# Patient Record
Sex: Male | Born: 1955 | Race: White | Hispanic: No | Marital: Married | Smoking: Former smoker
Health system: Southern US, Community
[De-identification: ages and names within clinical notes are randomized; demographics above are authoritative.]

## PROBLEM LIST (undated history)

## (undated) DIAGNOSIS — M1990 Unspecified osteoarthritis, unspecified site: Secondary | ICD-10-CM

## (undated) DIAGNOSIS — G8929 Other chronic pain: Secondary | ICD-10-CM

## (undated) DIAGNOSIS — M549 Dorsalgia, unspecified: Secondary | ICD-10-CM

## (undated) DIAGNOSIS — K219 Gastro-esophageal reflux disease without esophagitis: Secondary | ICD-10-CM

## (undated) DIAGNOSIS — Z9689 Presence of other specified functional implants: Secondary | ICD-10-CM

## (undated) DIAGNOSIS — N400 Enlarged prostate without lower urinary tract symptoms: Secondary | ICD-10-CM

## (undated) DIAGNOSIS — F419 Anxiety disorder, unspecified: Secondary | ICD-10-CM

## (undated) DIAGNOSIS — J449 Chronic obstructive pulmonary disease, unspecified: Secondary | ICD-10-CM

## (undated) DIAGNOSIS — I10 Essential (primary) hypertension: Secondary | ICD-10-CM

## (undated) HISTORY — PX: SPINAL CORD STIMULATOR INSERTION: SHX5378

## (undated) HISTORY — PX: SHOULDER SURGERY: SHX246

## (undated) HISTORY — PX: APPENDECTOMY: SHX54

## (undated) HISTORY — DX: Dorsalgia, unspecified: M54.9

## (undated) HISTORY — DX: Other chronic pain: G89.29

## (undated) HISTORY — PX: TENDON REPAIR: SHX5111

## (undated) HISTORY — DX: Gastro-esophageal reflux disease without esophagitis: K21.9

## (undated) HISTORY — DX: Benign prostatic hyperplasia without lower urinary tract symptoms: N40.0

---

## 1990-08-07 HISTORY — PX: ABDOMINAL SURGERY: SHX537

## 2001-08-07 DIAGNOSIS — K219 Gastro-esophageal reflux disease without esophagitis: Secondary | ICD-10-CM

## 2001-08-07 HISTORY — DX: Gastro-esophageal reflux disease without esophagitis: K21.9

## 2005-07-05 ENCOUNTER — Emergency Department (HOSPITAL_COMMUNITY): Admission: EM | Admit: 2005-07-05 | Discharge: 2005-07-06 | Payer: Self-pay | Admitting: Emergency Medicine

## 2011-04-19 ENCOUNTER — Emergency Department (HOSPITAL_COMMUNITY): Payer: PRIVATE HEALTH INSURANCE

## 2011-04-19 ENCOUNTER — Encounter: Payer: Self-pay | Admitting: *Deleted

## 2011-04-19 ENCOUNTER — Emergency Department (HOSPITAL_COMMUNITY)
Admission: EM | Admit: 2011-04-19 | Discharge: 2011-04-20 | Disposition: A | Discharge status: Home / Self Care | Payer: PRIVATE HEALTH INSURANCE | Attending: Emergency Medicine | Admitting: Emergency Medicine

## 2011-04-19 DIAGNOSIS — R05 Cough: Secondary | ICD-10-CM | POA: Insufficient documentation

## 2011-04-19 DIAGNOSIS — R059 Cough, unspecified: Secondary | ICD-10-CM | POA: Insufficient documentation

## 2011-04-19 DIAGNOSIS — R509 Fever, unspecified: Secondary | ICD-10-CM | POA: Insufficient documentation

## 2011-04-19 DIAGNOSIS — R112 Nausea with vomiting, unspecified: Secondary | ICD-10-CM | POA: Insufficient documentation

## 2011-04-19 DIAGNOSIS — Z87891 Personal history of nicotine dependence: Secondary | ICD-10-CM | POA: Insufficient documentation

## 2011-04-19 DIAGNOSIS — R42 Dizziness and giddiness: Secondary | ICD-10-CM | POA: Insufficient documentation

## 2011-04-19 NOTE — ED Notes (Signed)
Pt states he has been running fever and has n/v;

## 2011-04-19 NOTE — ED Provider Notes (Signed)
History     CSN: [id number] Arrival date & time: 04/19/2011  9:18 PM  Chief Complaint  Patient presents with  . Emesis  . Fever   Patient is a 55 y.o. male presenting with vomiting and fever. The history is provided by the patient and the spouse.  Emesis  This is a recurrent (Patient has had intermittent episodes feeling flu like symtpoms for the past month,  including all over body aches,  myalgia and subtle lightheadedness., which is not always worse with standing,  but can occur while supine.  ) problem. The current episode started more than 1 week ago (Worse the past 24 hours, with fever to 101 2 nights ago.  He reports an episode of emesis tonight only x 1.). The maximum temperature recorded prior to his arrival was 101 to 101.9 F. The fever has been present for less than 1 day. Associated symptoms include cough, a fever and sweats. Pertinent negatives include no abdominal pain, no arthralgias, no diarrhea and no headaches. Associated symptoms comments: Non productive cough.  Denies chest pain and denies shortness of breath..  Fever Primary symptoms of the febrile illness include fever, cough, nausea and vomiting. Primary symptoms do not include headaches, shortness of breath, abdominal pain, diarrhea, arthralgias or rash.    History reviewed. No pertinent past medical history.  Past Surgical History  Procedure Date  . Abdominal surgery   . Appendectomy   . Shoulder surgery right shoulder  . Tendon repair right wrist    History reviewed. No pertinent family history.  History  Substance Use Topics  . Smoking status: Former Games developer  . Smokeless tobacco: Not on file  . Alcohol Use: Not on file      Review of Systems  Constitutional: Positive for fever. Negative for appetite change.  HENT: Negative for congestion, sore throat and neck pain.   Eyes: Negative.   Respiratory: Positive for cough. Negative for chest tightness and shortness of breath.   Cardiovascular: Negative  for chest pain and palpitations.  Gastrointestinal: Positive for nausea and vomiting. Negative for abdominal pain, diarrhea, constipation and abdominal distention.  Genitourinary: Negative.   Musculoskeletal: Negative for joint swelling and arthralgias.  Skin: Negative.  Negative for rash and wound.  Neurological: Positive for light-headedness. Negative for dizziness, weakness, numbness and headaches.  Hematological: Negative.   Psychiatric/Behavioral: Negative.     Physical Exam  BP 127/79  Pulse 88  Temp(Src) 99.4 F (37.4 C) (Oral)  Resp 20  Ht 5\' 8"  (1.727 m)  Wt 206 lb (93.441 kg)  BMI 31.32 kg/m2  SpO2 95%  Physical Exam  Nursing note and vitals reviewed. Constitutional: He is oriented to person, place, and time. He appears well-developed and well-nourished.  HENT:  Head: Normocephalic and atraumatic.  Right Ear: External ear normal.  Left Ear: External ear normal.  Eyes: Conjunctivae are normal.  Neck: Normal range of motion. Neck supple. No thyromegaly present.  Cardiovascular: Normal rate, regular rhythm, normal heart sounds and intact distal pulses.   Pulmonary/Chest: Effort normal and breath sounds normal. No respiratory distress. He has no wheezes. He has no rales. He exhibits no tenderness.  Abdominal: Soft. Bowel sounds are normal. There is no tenderness.  Musculoskeletal: Normal range of motion. He exhibits no edema and no tenderness.  Lymphadenopathy:    He has no cervical adenopathy.  Neurological: He is alert and oriented to person, place, and time.  Skin: Skin is warm and dry.  Psychiatric: He has a normal mood and affect.  ED Course  Procedures  Patient received NS IV while here,  Given zithromax 500 IV (which he vomited up with the episode of emesis this evening).  MDM Febrile illness of unclear etiology,  With waxing/waning sx over the next past 3-4 weeks.  FUO without evidence for dangerous or life threatening illness tonight.  Labs,  cxr  reviewed with no pertinent positive findings.  Referral to pcp for further management.     Candis Musa, PA 04/20/11 [phone]

## 2011-04-19 NOTE — ED Notes (Addendum)
C/o of cold symptoms x 1 month, c/o light headedness and SOB, N/V x 1 today, was told yesterday by PCP that he had allergies, denies CP

## 2011-04-20 LAB — URINALYSIS, ROUTINE W REFLEX MICROSCOPIC
Bilirubin Urine: NEGATIVE
Glucose, UA: NEGATIVE mg/dL
Hgb urine dipstick: NEGATIVE
Specific Gravity, Urine: >1.03 — ABNORMAL HIGH (ref 1.005–1.030)
Urobilinogen, UA: 0.2 mg/dL (ref 0.0–1.0)
pH: 6 (ref 5.0–8.0)

## 2011-04-20 LAB — DIFFERENTIAL
Eosinophils Absolute: 0.2 10*3/uL (ref 0.0–0.7)
Eosinophils Relative: 2 % (ref 0–5)
Lymphocytes Relative: 25 % (ref 12–46)
Lymphs Abs: 2 10*3/uL (ref 0.7–4.0)
Monocytes Absolute: 0.8 10*3/uL (ref 0.1–1.0)
Monocytes Relative: 9 % (ref 3–12)

## 2011-04-20 LAB — BASIC METABOLIC PANEL
BUN: 13 mg/dL (ref 6–23)
CO2: 27 mEq/L (ref 19–32)
Calcium: 9.9 mg/dL (ref 8.4–10.5)
GFR calc non Af Amer: >60 mL/min (ref >60)
Glucose, Bld: 113 mg/dL — ABNORMAL HIGH (ref 70–99)

## 2011-04-20 LAB — CBC
HCT: 46.1 % (ref 39.0–52.0)
Hemoglobin: 16 g/dL (ref 13.0–17.0)
MCH: 31.1 pg (ref 26.0–34.0)
MCV: 89.5 fL (ref 78.0–100.0)
RBC: 5.15 MIL/uL (ref 4.22–5.81)

## 2011-04-20 MED ORDER — DEXTROSE 5 % IV SOLN
500.0000 mg | INTRAVENOUS | Status: DC
  Administered 2011-04-20: 500 mg via INTRAVENOUS
  Filled 2011-04-20: qty 500

## 2011-04-20 MED ORDER — SODIUM CHLORIDE 0.9 % IV BOLUS (SEPSIS)
1000.0000 mL | Freq: Once | INTRAVENOUS | Status: AC
  Administered 2011-04-20: 1000 mL via INTRAVENOUS

## 2011-04-20 NOTE — ED Notes (Signed)
Provided patient with sandwich and ginger ale per nurse

## 2011-04-26 NOTE — ED Provider Notes (Signed)
Medical screening examination/treatment/procedure(s) were performed by non-physician practitioner and as supervising physician I was immediately available for consultation/collaboration.   Geoffery Lyons, MD 04/26/11 [phone]

## 2011-07-20 ENCOUNTER — Telehealth: Payer: Self-pay

## 2011-07-20 NOTE — Telephone Encounter (Signed)
Pt referred for colonoscopy. Needs OV first due to meds. LMOM for a return call.

## 2011-07-24 NOTE — Telephone Encounter (Signed)
Pt has appt on 12/192012 for an OV.

## 2011-07-26 ENCOUNTER — Encounter: Payer: Self-pay | Admitting: Urgent Care

## 2011-07-26 ENCOUNTER — Ambulatory Visit (INDEPENDENT_AMBULATORY_CARE_PROVIDER_SITE_OTHER): Payer: Medicare Other | Admitting: Urgent Care

## 2011-07-26 DIAGNOSIS — Z1211 Encounter for screening for malignant neoplasm of colon: Secondary | ICD-10-CM

## 2011-07-26 DIAGNOSIS — K219 Gastro-esophageal reflux disease without esophagitis: Secondary | ICD-10-CM | POA: Insufficient documentation

## 2011-07-26 MED ORDER — PEG-KCL-NACL-NASULF-NA ASC-C 100 G PO SOLR: 1.0000 | Freq: Once | ORAL | Status: DC

## 2011-07-26 NOTE — Assessment & Plan Note (Signed)
well-controlled on Nexium 40 mg daily. Screening EGD for Barrett esophagus at the same time as colonoscopy given chronic GERD for many years.  I have discussed risks & benefits which include, but are not limited to, bleeding, infection, perforation & drug reaction.  The patient agrees with this plan & written consent will be obtained.

## 2011-07-26 NOTE — Assessment & Plan Note (Signed)
Douglas Davidson is a pleasant 55 y.o. male due for screening colonoscopy. No alarm features. I have discussed risks & benefits which include, but are not limited to, bleeding, infection, perforation & drug reaction.  The patient agrees with this plan & written consent will be obtained.  Procedure will need to be done with deep sedation (propofol) in the OR under the direction of anesthesia services for polypharmacy.

## 2011-07-26 NOTE — Patient Instructions (Addendum)
You need a screening colonoscopy and EGD. Continue Nexium 40 mg daily.

## 2011-07-26 NOTE — Progress Notes (Signed)
Referring Provider: Omar Person, FNP, [facility], [place], [place] Primary Care Physician:  Omar Person, FNP, [facility] Primary Gastroenterologist:  Dr. Jena Gauss  Chief Complaint  Patient presents with  . Colonoscopy    HPI:  Douglas Davidson is a 55 y.o. male here as a referral from Omar Person, FNP screening colonoscopy. He was brought into the office due to his medications with the concern for the need of propofol for adequate sedation for his procedure. Upon further questioning it is noted that he has chronic GERD with symptoms more than 3 days a week for many years, and he is a candidate for screening EGD for Barrett's esophagus. His GERD has been well-controlled on Nexium 40 mg daily for several years now. Denies any upper GI symptoms including heartburn, indigestion, nausea, vomiting, dysphagia, odynophagia or anorexia. Denies any lower GI symptoms including constipation, diarrhea, rectal bleeding, melena or weight loss.  Past Medical History  Diagnosis Date  . BPH (benign prostatic hypertrophy)   . Chronic back pain     & neck  . GERD (gastroesophageal reflux disease) 2003    Past Surgical History  Procedure Date  . Abdominal surgery 1992    adhesions/?colon  . Appendectomy   . Shoulder surgery right shoulder  . Tendon repair right wrist    Current Outpatient Prescriptions  Medication Sig Dispense Refill  . cetirizine (ZYRTEC) 10 MG tablet Take 10 mg by mouth daily.        Effie Berkshire 18-103 MCG/ACT inhaler Inhale 2 puffs into the lungs Twice daily.      . cyclobenzaprine (FLEXERIL) 10 MG tablet Take 10 mg by mouth 3 (three) times daily as needed.        . doxazosin (CARDURA) 8 MG tablet Take 8 mg by mouth at bedtime.        . DULoxetine (CYMBALTA) 60 MG capsule Take 60 mg by mouth daily.        . finasteride (PROSCAR) 5 MG tablet Take 5 mg by mouth daily.       . fluticasone (VERAMYST) 27.5 MCG/SPRAY nasal spray Place 2 sprays into the nose daily.         Marland Kitchen NEXIUM 40 MG capsule Take 40 mg by mouth daily before breakfast.       . oxyCODONE-acetaminophen (PERCOCET) 7.5-325 MG per tablet Take 1 tablet by mouth every 4 (four) hours as needed. For pain       . Pediatric Multivit-Minerals-C (GUMMI BEAR MULTIVITAMIN/MIN PO) Take 1 each by mouth daily.        . peg 3350 powder (MOVIPREP) 100 G SOLR Take 1 kit (100 g total) by mouth once. As directed Please purchase 1 Fleets enema to use with the prep  1 kit  0    Allergies as of 07/26/2011  . (No Known Allergies)    Family History:There is no known family history of colorectal carcinoma , liver disease, or inflammatory bowel disease.  Problem Relation Age of Onset  . Prostate cancer Father     & brother  . COPD Father   . Stroke Father   . Coronary artery disease Father   . Lymphoma Mother   . Hypertension Mother     History   Social History  . Marital Status: Married    Spouse Name: N/A    Number of Children: 0  . Years of Education: N/A   Occupational History  . disabled-truck driver    Social History Main Topics  . Smoking  status: Former Smoker -- 1.0 packs/day for 35 years    Types: Cigarettes    Quit date: 07/25/2010  . Smokeless tobacco: Not on file  . Alcohol Use: No  . Drug Use: No  . Sexually Active: Not on file   Other Topics Concern  . Not on file   Social History Narrative   Lives w/ wife & 5 adopted children  Review of Systems: Gen: Denies any fever, chills, sweats, anorexia, fatigue, weakness, malaise, weight loss, and sleep disorder CV: Denies chest pain, angina, palpitations, syncope, orthopnea, PND, peripheral edema, and claudication. Resp: Denies dyspnea at rest, dyspnea with exercise, cough, sputum, wheezing, coughing up blood, and pleurisy. GI: Denies vomiting blood, jaundice, and fecal incontinence.   Denies dysphagia or odynophagia. GU : Denies urinary burning, blood in urine, urinary frequency, urinary hesitancy, nocturnal urination, and  urinary incontinence. MS: Denies joint pain, limitation of movement, and swelling, stiffness, low back pain, extremity pain. Denies muscle weakness, cramps, atrophy.  Derm: Denies rash, itching, dry skin, hives, moles, warts, or unhealing ulcers.  Psych: Denies depression, anxiety, memory loss, suicidal ideation, hallucinations, paranoia, and confusion. Heme: Denies bruising, bleeding, and enlarged lymph nodes. Neuro:  Denies any headaches, dizziness, paresthesias. Endo:  Denies any problems with DM, thyroid, adrenal function.  Physical Exam: BP 123/82  Pulse 101  Temp(Src) 98 F (36.7 C) (Temporal)  Ht 5\' 8"  (1.727 m)  Wt 214 lb 9.6 oz (97.342 kg)  BMI 32.63 kg/m2 General:   Alert,  Well-developed, well-nourished, pleasant and cooperative in NAD.  Accompanied by his wife today, [place]. Head:  Normocephalic and atraumatic. Eyes:  Sclera clear, no icterus.   Conjunctiva pink. Ears:  Normal auditory acuity. Nose:  No deformity, discharge, or lesions. Mouth:  No deformity or lesions,oropharynx pink & moist. Neck:  Supple; no masses or thyromegaly. Lungs:  Clear throughout to auscultation.   No wheezes, crackles, or rhonchi. No acute distress. Heart:  Regular rate and rhythm; no murmurs, clicks, rubs,  or gallops. Abdomen:  Normal bowel sounds.  No bruits.  Soft, non-tender and non-distended without masses, hepatosplenomegaly or hernias noted.  No guarding or rebound tenderness.   Rectal:  Deferred. Msk:  Symmetrical without gross deformities. Normal posture. Pulses:  Normal pulses noted. Extremities:  No clubbing or edema. Neurologic:  Alert and oriented x4;  grossly normal neurologically. Skin:  Intact without significant lesions or rashes. Lymph Nodes:  No significant cervical adenopathy. Psych:  Alert and cooperative. Normal mood and affect.

## 2011-07-26 NOTE — Progress Notes (Signed)
Cc to PCP 

## 2011-08-10 ENCOUNTER — Encounter (HOSPITAL_COMMUNITY): Payer: Self-pay | Admitting: Pharmacy Technician

## 2011-08-18 ENCOUNTER — Other Ambulatory Visit: Payer: Self-pay

## 2011-08-18 ENCOUNTER — Encounter (HOSPITAL_COMMUNITY): Payer: Self-pay

## 2011-08-18 ENCOUNTER — Encounter (HOSPITAL_COMMUNITY)
Admission: RE | Admit: 2011-08-18 | Discharge: 2011-08-18 | Disposition: A | Discharge status: Home / Self Care | Payer: Medicare Other | Source: Ambulatory Visit | Attending: Internal Medicine | Admitting: Internal Medicine

## 2011-08-18 HISTORY — DX: Essential (primary) hypertension: I10

## 2011-08-18 HISTORY — DX: Chronic obstructive pulmonary disease, unspecified: J44.9

## 2011-08-18 HISTORY — DX: Unspecified osteoarthritis, unspecified site: M19.90

## 2011-08-18 HISTORY — DX: Anxiety disorder, unspecified: F41.9

## 2011-08-18 LAB — BASIC METABOLIC PANEL
BUN: 13 mg/dL (ref 6–23)
Creatinine, Ser: 1.05 mg/dL (ref 0.50–1.35)
GFR calc Af Amer: >90 mL/min (ref >90)
GFR calc non Af Amer: 79 mL/min — ABNORMAL LOW (ref >90)
Glucose, Bld: 107 mg/dL — ABNORMAL HIGH (ref 70–99)

## 2011-08-18 NOTE — Patient Instructions (Addendum)
21 Douglas Davidson  08/18/2011   Your procedure is scheduled on:  08/24/2011  Report to [facility] at  755  AM.  Call this number if you have problems the morning of surgery: [phone]   Remember:   Do not eat food:After Midnight.  May have clear liquids:until Midnight .  Clear liquids include soda, tea, black coffee, apple or grape juice, broth.  Take these medicines the morning of surgery with A SIP OF WATER: zyrtec,flexaril,doxazosin,duloxetine,nexium,oxycodone. Take combivent and fluticasone before you come.   Do not wear jewelry, make-up or nail polish.  Do not wear lotions, powders, or perfumes. You may wear deodorant.  Do not shave 48 hours prior to surgery.  Do not bring valuables to the hospital.  Contacts, dentures or bridgework may not be worn into surgery.  Leave suitcase in the car. After surgery it may be brought to your room.  For patients admitted to the hospital, checkout time is 11:00 AM the day of discharge.   Patients discharged the day of surgery will not be allowed to drive home.  Name and phone number of your driver: family  Special Instructions: N/A   Please read over the following fact sheets that you were given: Pain Booklet, Surgical Site Infection Prevention, Anesthesia Post-op Instructions and Care and Recovery After Surgery Esophagogastroduodenoscopy This is an endoscopic procedure (a procedure that uses a device like a flexible telescope) that allows your caregiver to view the upper stomach and small bowel. This test allows your caregiver to look at the esophagus. The esophagus carries food from your mouth to your stomach. They can also look at your duodenum. This is the first part of the small intestine that attaches to the stomach. This test is used to detect problems in the bowel such as ulcers and inflammation. PREPARATION FOR TEST Nothing to eat after midnight the day before the test. NORMAL FINDINGS Normal esophagus, stomach, and duodenum. Ranges  for normal findings may vary among different laboratories and hospitals. You should always check with your doctor after having lab work or other tests done to discuss the meaning of your test results and whether your values are considered within normal limits. MEANING OF TEST  Your caregiver will go over the test results with you and discuss the importance and meaning of your results, as well as treatment options and the need for additional tests if necessary. OBTAINING THE TEST RESULTS It is your responsibility to obtain your test results. Ask the lab or department performing the test when and how you will get your results. Document Released: 11/24/2004 Document Revised: 04/05/2011 Document Reviewed: 07/03/2008 [facility] Patient Information 2012 [place], [place].Colonoscopy A colonoscopy is an exam to evaluate your entire colon. In this exam, your colon is cleansed. A long fiberoptic tube is inserted through your rectum and into your colon. The fiberoptic scope (endoscope) is a long bundle of enclosed and very flexible fibers. These fibers transmit light to the area examined and send images from that area to your caregiver. Discomfort is usually minimal. You may be given a drug to help you sleep (sedative) during or prior to the procedure. This exam helps to detect lumps (tumors), polyps, inflammation, and areas of bleeding. Your caregiver may also take a small piece of tissue (biopsy) that will be examined under a microscope. LET YOUR CAREGIVER KNOW ABOUT:   Allergies to food or medicine.   Medicines taken, including vitamins, herbs, eyedrops, over-the-counter medicines, and creams.   Use of steroids (by mouth or creams).  Previous problems with anesthetics or numbing medicines.   History of bleeding problems or blood clots.   Previous surgery.   Other health problems, including diabetes and kidney problems.   Possibility of pregnancy, if this applies.  BEFORE THE PROCEDURE   A clear  liquid diet may be required for 2 days before the exam.   Ask your caregiver about changing or stopping your regular medications.   Liquid injections (enemas) or laxatives may be required.   A large amount of electrolyte solution may be given to you to drink over a short period of time. This solution is used to clean out your colon.   You should be present 60 minutes prior to your procedure or as directed by your caregiver.  AFTER THE PROCEDURE   If you received a sedative or pain relieving medication, you will need to arrange for someone to drive you home.   Occasionally, there is a little blood passed with the first bowel movement. Do not be concerned.  FINDING OUT THE RESULTS OF YOUR TEST Not all test results are available during your visit. If your test results are not back during the visit, make an appointment with your caregiver to find out the results. Do not assume everything is normal if you have not heard from your caregiver or the medical facility. It is important for you to follow up on all of your test results. HOME CARE INSTRUCTIONS   It is not unusual to pass moderate amounts of gas and experience mild abdominal cramping following the procedure. This is due to air being used to inflate your colon during the exam. Walking or a warm pack on your belly (abdomen) may help.   You may resume all normal meals and activities after sedatives and medicines have worn off.   Only take over-the-counter or prescription medicines for pain, discomfort, or fever as directed by your caregiver. Do not use aspirin or blood thinners if a biopsy was taken. Consult your caregiver for medicine usage if biopsies were taken.  SEEK IMMEDIATE MEDICAL CARE IF:   You have a fever.   You pass large blood clots or fill a toilet with blood following the procedure. This may also occur 10 to 14 days following the procedure. This is more likely if a biopsy was taken.   You develop abdominal pain that keeps  getting worse and cannot be relieved with medicine.  Document Released: 07/21/2000 Document Revised: 04/05/2011 Document Reviewed: 03/05/2008 [facility] Patient Information 2012 [place], [place].PATIENT INSTRUCTIONS POST-ANESTHESIA  IMMEDIATELY FOLLOWING SURGERY:  Do not drive or operate machinery for the first twenty four hours after surgery.  Do not make any important decisions for twenty four hours after surgery or while taking narcotic pain medications or sedatives.  If you develop intractable nausea and vomiting or a severe headache please notify your doctor immediately.  FOLLOW-UP:  Please make an appointment with your surgeon as instructed. You do not need to follow up with anesthesia unless specifically instructed to do so.  WOUND CARE INSTRUCTIONS (if applicable):  Keep a dry clean dressing on the anesthesia/puncture wound site if there is drainage.  Once the wound has quit draining you may leave it open to air.  Generally you should leave the bandage intact for twenty four hours unless there is drainage.  If the epidural site drains for more than 36-48 hours please call the anesthesia department.  QUESTIONS?:  Please feel free to call your physician or the hospital operator if you have any questions,  and they will be happy to assist you.     [facility] Anesthesia Department [address] [place] [phone]

## 2011-08-22 NOTE — Progress Notes (Signed)
Results Faxed to PCP.

## 2011-08-24 ENCOUNTER — Encounter (HOSPITAL_COMMUNITY): Payer: Self-pay | Admitting: *Deleted

## 2011-08-24 ENCOUNTER — Encounter (HOSPITAL_COMMUNITY)
Admission: RE | Disposition: A | Discharge status: Home / Self Care | Payer: Self-pay | Source: Ambulatory Visit | Attending: Internal Medicine

## 2011-08-24 ENCOUNTER — Ambulatory Visit (HOSPITAL_COMMUNITY)
Admission: RE | Admit: 2011-08-24 | Discharge: 2011-08-24 | Disposition: A | Discharge status: Home / Self Care | Payer: Medicare Other | Source: Ambulatory Visit | Attending: Internal Medicine | Admitting: Internal Medicine

## 2011-08-24 ENCOUNTER — Encounter (HOSPITAL_COMMUNITY): Payer: Self-pay | Admitting: Anesthesiology

## 2011-08-24 ENCOUNTER — Ambulatory Visit (HOSPITAL_COMMUNITY): Payer: Medicare Other | Admitting: Anesthesiology

## 2011-08-24 ENCOUNTER — Other Ambulatory Visit: Payer: Self-pay | Admitting: Internal Medicine

## 2011-08-24 DIAGNOSIS — Z800 Family history of malignant neoplasm of digestive organs: Secondary | ICD-10-CM

## 2011-08-24 DIAGNOSIS — D126 Benign neoplasm of colon, unspecified: Secondary | ICD-10-CM

## 2011-08-24 DIAGNOSIS — D129 Benign neoplasm of anus and anal canal: Secondary | ICD-10-CM

## 2011-08-24 DIAGNOSIS — K219 Gastro-esophageal reflux disease without esophagitis: Secondary | ICD-10-CM | POA: Insufficient documentation

## 2011-08-24 DIAGNOSIS — Z01810 Encounter for preprocedural cardiovascular examination: Secondary | ICD-10-CM | POA: Insufficient documentation

## 2011-08-24 DIAGNOSIS — Z1211 Encounter for screening for malignant neoplasm of colon: Secondary | ICD-10-CM

## 2011-08-24 DIAGNOSIS — D128 Benign neoplasm of rectum: Secondary | ICD-10-CM

## 2011-08-24 DIAGNOSIS — K449 Diaphragmatic hernia without obstruction or gangrene: Secondary | ICD-10-CM

## 2011-08-24 DIAGNOSIS — J449 Chronic obstructive pulmonary disease, unspecified: Secondary | ICD-10-CM | POA: Insufficient documentation

## 2011-08-24 DIAGNOSIS — Z79899 Other long term (current) drug therapy: Secondary | ICD-10-CM | POA: Insufficient documentation

## 2011-08-24 DIAGNOSIS — J4489 Other specified chronic obstructive pulmonary disease: Secondary | ICD-10-CM | POA: Insufficient documentation

## 2011-08-24 DIAGNOSIS — I10 Essential (primary) hypertension: Secondary | ICD-10-CM | POA: Insufficient documentation

## 2011-08-24 DIAGNOSIS — Z01812 Encounter for preprocedural laboratory examination: Secondary | ICD-10-CM | POA: Insufficient documentation

## 2011-08-24 HISTORY — PX: POLYPECTOMY: SHX5525

## 2011-08-24 SURGERY — COLONOSCOPY WITH PROPOFOL
Anesthesia: Monitor Anesthesia Care

## 2011-08-24 MED ORDER — MIDAZOLAM HCL 5 MG/5ML IJ SOLN
INTRAMUSCULAR | Status: DC | PRN
  Administered 2011-08-24: 2 mg via INTRAVENOUS

## 2011-08-24 MED ORDER — MIDAZOLAM HCL 2 MG/2ML IJ SOLN
INTRAMUSCULAR | Status: AC
  Filled 2011-08-24: qty 2

## 2011-08-24 MED ORDER — PROPOFOL 10 MG/ML IV EMUL
INTRAVENOUS | Status: AC
  Filled 2011-08-24: qty 20

## 2011-08-24 MED ORDER — STERILE WATER FOR IRRIGATION IR SOLN
Status: DC | PRN
  Administered 2011-08-24: 10:00:00

## 2011-08-24 MED ORDER — MIDAZOLAM HCL 2 MG/2ML IJ SOLN
INTRAMUSCULAR | Status: AC
  Administered 2011-08-24: 2 mg via INTRAVENOUS
  Filled 2011-08-24: qty 2

## 2011-08-24 MED ORDER — FENTANYL CITRATE 0.05 MG/ML IJ SOLN
INTRAMUSCULAR | Status: DC | PRN
  Administered 2011-08-24: 50 ug via INTRAVENOUS

## 2011-08-24 MED ORDER — LACTATED RINGERS IV SOLN
INTRAVENOUS | Status: DC
  Administered 2011-08-24: 09:00:00 via INTRAVENOUS

## 2011-08-24 MED ORDER — PROPOFOL 10 MG/ML IV EMUL
INTRAVENOUS | Status: DC | PRN
  Administered 2011-08-24: 100 ug/kg/min via INTRAVENOUS

## 2011-08-24 MED ORDER — ONDANSETRON HCL 4 MG/2ML IJ SOLN
4.0000 mg | Freq: Once | INTRAMUSCULAR | Status: AC
  Administered 2011-08-24: 4 mg via INTRAVENOUS

## 2011-08-24 MED ORDER — MIDAZOLAM HCL 2 MG/2ML IJ SOLN
1.0000 mg | INTRAMUSCULAR | Status: DC | PRN
  Administered 2011-08-24: 2 mg via INTRAVENOUS

## 2011-08-24 MED ORDER — ONDANSETRON HCL 4 MG/2ML IJ SOLN
INTRAMUSCULAR | Status: AC
  Administered 2011-08-24: 4 mg via INTRAVENOUS
  Filled 2011-08-24: qty 2

## 2011-08-24 MED ORDER — BUTAMBEN-TETRACAINE-BENZOCAINE 2-2-14 % EX AERO
1.0000 | INHALATION_SPRAY | Freq: Once | CUTANEOUS | Status: AC
  Administered 2011-08-24: 1 via TOPICAL

## 2011-08-24 MED ORDER — PROPOFOL 10 MG/ML IV BOLUS
INTRAVENOUS | Status: DC | PRN
  Administered 2011-08-24: 20 mg via INTRAVENOUS
  Administered 2011-08-24: 10 mg via INTRAVENOUS
  Administered 2011-08-24: 20 mg via INTRAVENOUS

## 2011-08-24 MED ORDER — FENTANYL CITRATE 0.05 MG/ML IJ SOLN: 25.0000 ug | INTRAMUSCULAR | Status: DC | PRN

## 2011-08-24 MED ORDER — ONDANSETRON HCL 4 MG/2ML IJ SOLN: 4.0000 mg | Freq: Once | INTRAMUSCULAR | Status: DC | PRN

## 2011-08-24 MED ORDER — FENTANYL CITRATE 0.05 MG/ML IJ SOLN
INTRAMUSCULAR | Status: AC
  Filled 2011-08-24: qty 2

## 2011-08-24 SURGICAL SUPPLY — 25 items
BLOCK BITE 60FR ADLT L/F BLUE (MISCELLANEOUS) ×4 IMPLANT
DEVICE CLIP HEMOSTAT 235CM (CLIP) IMPLANT
ELECT REM PT RETURN 9FT ADLT (ELECTROSURGICAL)
ELECTRODE REM PT RTRN 9FT ADLT (ELECTROSURGICAL) IMPLANT
FCP BXJMBJMB 240X2.8X (CUTTING FORCEPS)
FLOOR PAD 36X40 (MISCELLANEOUS) ×2
FORCEP RJ3 GP 1.8X160 W-NEEDLE (CUTTING FORCEPS) ×2 IMPLANT
FORCEPS BIOP RAD 4 LRG CAP 4 (CUTTING FORCEPS) IMPLANT
FORCEPS BIOP RJ4 240 W/NDL (CUTTING FORCEPS)
FORCEPS BXJMBJMB 240X2.8X (CUTTING FORCEPS) IMPLANT
INJECTOR/SNARE I SNARE (MISCELLANEOUS) IMPLANT
LUBRICANT JELLY 4.5OZ STERILE (MISCELLANEOUS) ×2 IMPLANT
MANIFOLD NEPTUNE II (INSTRUMENTS) ×2 IMPLANT
MANIFOLD NEPTUNE WASTE (CANNULA) ×2 IMPLANT
NEEDLE SCLEROTHERAPY 25GX240 (NEEDLE) IMPLANT
PAD FLOOR 36X40 (MISCELLANEOUS) ×1 IMPLANT
PROBE APC STR FIRE (PROBE) IMPLANT
PROBE INJECTION GOLD (MISCELLANEOUS)
PROBE INJECTION GOLD 7FR (MISCELLANEOUS) IMPLANT
SNARE ROTATE MED OVAL 20MM (MISCELLANEOUS) IMPLANT
SYR 50ML LL SCALE MARK (SYRINGE) ×2 IMPLANT
TRAP SPECIMEN MUCOUS 40CC (MISCELLANEOUS) IMPLANT
TUBING ENDO SMARTCAP PENTAX (MISCELLANEOUS) ×2 IMPLANT
TUBING IRRIGATION ENDOGATOR (MISCELLANEOUS) ×2 IMPLANT
WATER STERILE IRR 1000ML POUR (IV SOLUTION) ×2 IMPLANT

## 2011-08-24 NOTE — Transfer of Care (Signed)
Immediate Anesthesia Transfer of Care Note  Patient: Douglas Davidson  Procedure(s) Performed:  COLONOSCOPY WITH PROPOFOL - start @ 0957. Entered cecum @ 1006; total withdrawal time =13       minutes; ESOPHAGOGASTRODUODENOSCOPY (EGD) WITH PROPOFOL - EGD end @ 0953; POLYPECTOMY - colon polyps  Patient Location: PACU  Anesthesia Type: MAC  Level of Consciousness: awake, alert  and oriented  Airway & Oxygen Therapy: Patient Spontanous Breathing and Patient connected to face mask oxygen  Post-op Assessment: Report given to PACU RN  Post vital signs: Reviewed and stable Filed Vitals:   08/24/11 0936  BP: 114/76  Pulse:   Temp:   Resp: 29    Complications: No apparent anesthesia complications

## 2011-08-24 NOTE — Op Note (Addendum)
[facility] [address] [place], [place]  [address]  ENDOSCOPY PROCEDURE REPORT  PATIENT:  Douglas, Davidson  MR#:  [id number] BIRTHDATE:  [birth year], 55 yrs. old  GENDER:  male  ENDOSCOPIST:  R. Roetta Sessions, MD [facility] Referred by:          Omar Person, FNP. [facility]  PROCEDURE DATE:  08/24/2011 PROCEDURE:  diagnostic EGD  INDICATIONS:  long-standing GERD  INFORMED CONSENT:   The risks, benefits, limitations, alternatives and imponderables have been discussed.  The potential for biopsy, esophogeal dilation, etc. have also been reviewed.  Questions have been answered.  All parties agreeable.  Please see the history and physical in the medical record for more information.  MEDICATIONS:   deep sedation per Dr. [facility].  DESCRIPTION OF PROCEDURE:   The  endoscope was introduced through the mouth and advanced to the second portion of the duodenum without difficulty or limitations.  The mucosal surfaces were surveyed very carefully during advancement of the scope and upon withdrawal.  Retroflexion view of the proximal stomach and esophagogastric junction was performed.  <<PROCEDUREIMAGES>>  FINDINGS:    normal esophageal mucosa. The stomach emptied. A small hiatal hernia; otherwise normal gastric mucosa. Normal first and second portion of the duodenum  THERAPEUTIC / DIAGNOSTIC MANEUVERS PERFORMED:    none  COMPLICATIONS:   None  IMPRESSION:       Small hiatal hernia; otherwise normal EGD  RECOMMENDATIONS:   Continue omeprazole daily indefinitely. See colonoscopy report  ______________________________ R. Roetta Sessions, MD [facility]  CC:  n. REVISED:  08/24/2011 11:38 AM eSIGNED:   R. Roetta Sessions at 08/24/2011 11:38 AM  [name], [phone]

## 2011-08-24 NOTE — Anesthesia Postprocedure Evaluation (Signed)
  Anesthesia Post-op Note  Patient: Douglas Davidson  Procedure(s) Performed:  COLONOSCOPY WITH PROPOFOL - start @ 0957. Entered cecum @ 1006; total withdrawal time =13       minutes; ESOPHAGOGASTRODUODENOSCOPY (EGD) WITH PROPOFOL - EGD end @ 0953; POLYPECTOMY - colon polyps  Patient Location: PACU  Anesthesia Type: MAC  Level of Consciousness: awake, alert  and oriented  Airway and Oxygen Therapy: Patient Spontanous Breathing  Post-op Pain: none  Post-op Assessment: Post-op Vital signs reviewed, Patient's Cardiovascular Status Stable, Respiratory Function Stable and No signs of Nausea or vomiting  Post-op Vital Signs: Reviewed and stable  Complications: No apparent anesthesia complications

## 2011-08-24 NOTE — Anesthesia Preprocedure Evaluation (Signed)
Anesthesia Evaluation  Patient identified by MRN, date of birth, ID band Patient awake    Reviewed: Allergy & Precautions, H&P , NPO status , Patient's Chart, lab work & pertinent test results  History of Anesthesia Complications Negative for: history of anesthetic complications  Airway Mallampati: I      Dental  (+) Edentulous Upper   Pulmonary COPD COPD inhaler, former smoker clear to auscultation        Cardiovascular hypertension, Pt. on medications Regular     Neuro/Psych Anxiety    GI/Hepatic GERD-  Medicated,  Endo/Other    Renal/GU      Musculoskeletal  (+) Arthritis - (chronic LBP),   Abdominal   Peds  Hematology   Anesthesia Other Findings   Reproductive/Obstetrics                           Anesthesia Physical Anesthesia Plan  ASA: III  Anesthesia Plan: MAC   Post-op Pain Management:    Induction: Intravenous  Airway Management Planned: Nasal Cannula  Additional Equipment:   Intra-op Plan:   Post-operative Plan:   Informed Consent: I have reviewed the patients History and Physical, chart, labs and discussed the procedure including the risks, benefits and alternatives for the proposed anesthesia with the patient or authorized representative who has indicated his/her understanding and acceptance.     Plan Discussed with:   Anesthesia Plan Comments:         Anesthesia Quick Evaluation

## 2011-08-24 NOTE — H&P (Signed)
  I have seen & examined the patient prior to the procedure(s) today and reviewed the history and physical/consultation.  There have been no changes.  After consideration of the risks, benefits, alternatives and imponderables, the patient has consented to the procedure(s).   

## 2011-08-24 NOTE — Progress Notes (Signed)
Awake. Throat numb. Swallowing without difficulty. Voices no c/o at this time.

## 2011-08-24 NOTE — Op Note (Addendum)
[facility] [address] [place], [place]  [address]  COLONOSCOPY PROCEDURE REPORT  PATIENT:  Douglas Davidson, Douglas Davidson  MR#:  [id number] BIRTHDATE:  [birth year], 55 yrs. old  GENDER:  male ENDOSCOPIST:  R. Roetta Sessions, MD FACP [facility] REF. BY:          Douglas Person, FNP. [facility]  PROCEDURE DATE:  08/24/2011 PROCEDURE:  Colonoscopy with snare polypectomy and polyp ablation  INDICATIONS:  high-risk screening colonoscopy. Positive family history of colon cancer first-degree relative.  INFORMED CONSENT:  The risks, benefits, alternatives and imponderables including but not limited to bleeding, perforation as well as the possibility of a missed lesion have been reviewed. The potential for biopsy, lesion removal, etc. have also been discussed.  Questions have been answered.  All parties agreeable. Please see the history and physical in the medical record for more information.  MEDICATIONS:  deep sedation per Dr. [facility]  DESCRIPTION OF PROCEDURE:  After a digital rectal exam was performed, the  colonoscope was advanced from the anus through the rectum and colon to the area of the cecum, ileocecal valve and appendiceal orifice.  The cecum was deeply intubated.  These structures were well-seen and photographed for the record.  From the level of the cecum and ileocecal valve, the scope was slowly and cautiously withdrawn.  The mucosal surfaces were carefully surveyed utilizing scope tip deflection to facilitate fold flattening as needed.  The scope was pulled down into the rectum where a thorough examination including retroflexion was performed. <<PROCEDUREIMAGES>>  FINDINGS:   suboptimal but dual preparation. Splenic flexure and sigmoid segment polyps; the largest being approximately 5 mm in dimensions. Multiple diminutive rectal and rectosigmoid polyps. Anal papilla; otherwise normal rectum  THERAPEUTIC / DIAGNOSTIC MANEUVERS PERFORMED:  the  splenic flexure polyp was hot snared. The sigmoid polyp was cold snared. The rectal and rectosigmoid polyps were ablated with the tip of the hot snare loop.  COMPLICATIONS:  none  CECAL WITHDRAWAL TIME:  13 minutes  IMPRESSION:    Colonic polyps-treated as described above.  RECOMMENDATIONS:   Followup  pathology. See EGD report  ______________________________ R. Roetta Sessions, MD Douglas Davidson  CC:  n. REVISED:  08/24/2011 11:36 AM eSIGNED:   R. Roetta Davidson at 08/24/2011 11:36 AM  Douglas Davidson, [id number]

## 2011-08-25 ENCOUNTER — Encounter (HOSPITAL_COMMUNITY): Payer: Self-pay | Admitting: Internal Medicine

## 2011-08-26 ENCOUNTER — Encounter: Payer: Self-pay | Admitting: Internal Medicine

## 2011-08-27 ENCOUNTER — Encounter: Payer: Self-pay | Admitting: Internal Medicine

## 2011-09-05 ENCOUNTER — Ambulatory Visit (INDEPENDENT_AMBULATORY_CARE_PROVIDER_SITE_OTHER): Payer: Medicare Other | Admitting: Urology

## 2011-09-05 DIAGNOSIS — N529 Male erectile dysfunction, unspecified: Secondary | ICD-10-CM

## 2011-09-05 DIAGNOSIS — Z125 Encounter for screening for malignant neoplasm of prostate: Secondary | ICD-10-CM

## 2011-09-05 DIAGNOSIS — N400 Enlarged prostate without lower urinary tract symptoms: Secondary | ICD-10-CM

## 2011-09-05 DIAGNOSIS — R6882 Decreased libido: Secondary | ICD-10-CM

## 2011-10-05 ENCOUNTER — Ambulatory Visit (INDEPENDENT_AMBULATORY_CARE_PROVIDER_SITE_OTHER): Payer: Medicare Other | Admitting: Otolaryngology

## 2012-06-01 ENCOUNTER — Emergency Department (HOSPITAL_COMMUNITY): Payer: Medicare Other

## 2012-06-01 ENCOUNTER — Encounter (HOSPITAL_COMMUNITY): Payer: Self-pay | Admitting: *Deleted

## 2012-06-01 ENCOUNTER — Observation Stay (HOSPITAL_COMMUNITY)
Admission: EM | Admit: 2012-06-01 | Discharge: 2012-06-02 | Disposition: A | Discharge status: Home / Self Care | Payer: Medicare Other | Attending: General Surgery | Admitting: General Surgery

## 2012-06-01 DIAGNOSIS — J4489 Other specified chronic obstructive pulmonary disease: Secondary | ICD-10-CM | POA: Insufficient documentation

## 2012-06-01 DIAGNOSIS — J449 Chronic obstructive pulmonary disease, unspecified: Secondary | ICD-10-CM | POA: Insufficient documentation

## 2012-06-01 DIAGNOSIS — R109 Unspecified abdominal pain: Secondary | ICD-10-CM | POA: Insufficient documentation

## 2012-06-01 DIAGNOSIS — Z23 Encounter for immunization: Secondary | ICD-10-CM | POA: Insufficient documentation

## 2012-06-01 DIAGNOSIS — R112 Nausea with vomiting, unspecified: Secondary | ICD-10-CM | POA: Insufficient documentation

## 2012-06-01 DIAGNOSIS — K56609 Unspecified intestinal obstruction, unspecified as to partial versus complete obstruction: Principal | ICD-10-CM | POA: Insufficient documentation

## 2012-06-01 DIAGNOSIS — K219 Gastro-esophageal reflux disease without esophagitis: Secondary | ICD-10-CM | POA: Insufficient documentation

## 2012-06-01 DIAGNOSIS — K3189 Other diseases of stomach and duodenum: Secondary | ICD-10-CM | POA: Insufficient documentation

## 2012-06-01 DIAGNOSIS — R197 Diarrhea, unspecified: Secondary | ICD-10-CM | POA: Insufficient documentation

## 2012-06-01 DIAGNOSIS — I10 Essential (primary) hypertension: Secondary | ICD-10-CM | POA: Insufficient documentation

## 2012-06-01 HISTORY — DX: Presence of other specified functional implants: Z96.89

## 2012-06-01 LAB — CBC WITH DIFFERENTIAL/PLATELET
Basophils Absolute: 0 10*3/uL (ref 0.0–0.1)
Eosinophils Absolute: 0 10*3/uL (ref 0.0–0.7)
Eosinophils Relative: 0 % (ref 0–5)
HCT: 47.6 % (ref 39.0–52.0)
Lymphocytes Relative: 11 % — ABNORMAL LOW (ref 12–46)
MCH: 30.9 pg (ref 26.0–34.0)
MCV: 88 fL (ref 78.0–100.0)
Monocytes Absolute: 0.7 10*3/uL (ref 0.1–1.0)
Platelets: 292 10*3/uL (ref 150–400)
RDW: 13.6 % (ref 11.5–15.5)
WBC: 14.4 10*3/uL — ABNORMAL HIGH (ref 4.0–10.5)

## 2012-06-01 LAB — COMPREHENSIVE METABOLIC PANEL
AST: 33 U/L (ref 0–37)
CO2: 21 mEq/L (ref 19–32)
Calcium: 10.3 mg/dL (ref 8.4–10.5)
Creatinine, Ser: 0.99 mg/dL (ref 0.50–1.35)
GFR calc Af Amer: >90 mL/min (ref >90)
GFR calc non Af Amer: >90 mL/min (ref >90)
Glucose, Bld: 149 mg/dL — ABNORMAL HIGH (ref 70–99)
Sodium: 137 mEq/L (ref 135–145)
Total Protein: 8.7 g/dL — ABNORMAL HIGH (ref 6.0–8.3)

## 2012-06-01 MED ORDER — PNEUMOCOCCAL VAC POLYVALENT 25 MCG/0.5ML IJ INJ
0.5000 mL | INJECTION | INTRAMUSCULAR | Status: AC
  Administered 2012-06-02: 0.5 mL via INTRAMUSCULAR
  Filled 2012-06-01: qty 0.5

## 2012-06-01 MED ORDER — SODIUM CHLORIDE 0.9 % IV BOLUS (SEPSIS)
1000.0000 mL | Freq: Once | INTRAVENOUS | Status: AC
  Administered 2012-06-01: 1000 mL via INTRAVENOUS

## 2012-06-01 MED ORDER — SODIUM CHLORIDE 0.9 % IJ SOLN
INTRAMUSCULAR | Status: AC
  Administered 2012-06-01: 10 mL
  Filled 2012-06-01: qty 3

## 2012-06-01 MED ORDER — LACTATED RINGERS IV SOLN
INTRAVENOUS | Status: DC
  Administered 2012-06-01 – 2012-06-02 (×2): via INTRAVENOUS

## 2012-06-01 MED ORDER — IOHEXOL 300 MG/ML  SOLN
100.0000 mL | Freq: Once | INTRAMUSCULAR | Status: AC | PRN
  Administered 2012-06-01: 100 mL via INTRAVENOUS

## 2012-06-01 MED ORDER — MORPHINE SULFATE 2 MG/ML IJ SOLN
2.0000 mg | INTRAMUSCULAR | Status: DC | PRN
  Administered 2012-06-01 (×2): 2 mg via INTRAVENOUS
  Filled 2012-06-01 (×2): qty 1

## 2012-06-01 MED ORDER — MORPHINE SULFATE 4 MG/ML IJ SOLN
4.0000 mg | Freq: Once | INTRAMUSCULAR | Status: AC
  Administered 2012-06-01: 4 mg via INTRAVENOUS
  Filled 2012-06-01: qty 1

## 2012-06-01 MED ORDER — ONDANSETRON HCL 4 MG/2ML IJ SOLN: 4.0000 mg | Freq: Four times a day (QID) | INTRAMUSCULAR | Status: DC | PRN

## 2012-06-01 MED ORDER — SODIUM CHLORIDE 0.9 % IV SOLN: INTRAVENOUS | Status: AC

## 2012-06-01 MED ORDER — PANTOPRAZOLE SODIUM 40 MG IV SOLR
40.0000 mg | Freq: Every day | INTRAVENOUS | Status: DC
  Administered 2012-06-01: 40 mg via INTRAVENOUS
  Filled 2012-06-01: qty 40

## 2012-06-01 MED ORDER — KETOROLAC TROMETHAMINE 30 MG/ML IJ SOLN
30.0000 mg | Freq: Once | INTRAMUSCULAR | Status: AC
  Administered 2012-06-01: 30 mg via INTRAVENOUS
  Filled 2012-06-01: qty 1

## 2012-06-01 MED ORDER — ONDANSETRON HCL 4 MG/2ML IJ SOLN
4.0000 mg | Freq: Once | INTRAMUSCULAR | Status: AC
  Administered 2012-06-01: 4 mg via INTRAVENOUS
  Filled 2012-06-01: qty 2

## 2012-06-01 NOTE — ED Notes (Signed)
N/v/d and abd pain that started approx 0030 this morning.

## 2012-06-01 NOTE — ED Provider Notes (Cosign Needed)
History   This chart was scribed for Geoffery Lyons, MD scribed by Magnus Sinning. The patient was seen in room [facility] at 9:57   CSN: [id number]  Arrival date & time 06/01/12  0932   Chief Complaint  Patient presents with  . Emesis    (Consider location/radiation/quality/duration/timing/severity/associated sxs/prior treatment) The history is provided by the patient and the spouse. No language interpreter was used.  Douglas Davidson is a 56 y.o. male who presents to the Emergency Department complaining of constant moderate emesis with associated nausea, abd pain, dyspepsia, and diarrhea, onset last night after going to bed.Patient states he began having emesis with abd pain around 23:30 last night. He says everything he eats comes up and has been green colored appearing, also noting he has not seen any traces of blood. Patient denies any urinary sxs, fever or sick contact exposure, but does report hx of appendectomy and an abd surgery done 20 years ago performed in [place] for treatment of a perforated small colon. Patient also denies hx of cholecystectomy.   Past Medical History  Diagnosis Date  . BPH (benign prostatic hypertrophy)   . Chronic back pain     & neck  . GERD (gastroesophageal reflux disease) 2003  . Hypertension   . COPD (chronic obstructive pulmonary disease)   . Anxiety   . Arthritis   . Spinal cord stimulator status     Past Surgical History  Procedure Date  . Abdominal surgery 1992    adhesions/?colon  . Appendectomy   . Shoulder surgery right shoulder    rotator cuff  . Tendon repair right wrist  . Polypectomy 08/24/2011    Procedure: POLYPECTOMY;  Surgeon: Corbin Ade, MD;  Location: [facility];  Service: Endoscopy;  Laterality: N/A;  colon polyps  . Spinal cord stimulator insertion     Family History  Problem Relation Age of Onset  . Prostate cancer Father     & brother  . COPD Father   . Stroke Father   . Coronary artery disease Father   .  Lymphoma Mother   . Hypertension Mother   . Anesthesia problems Neg Hx   . Hypotension Neg Hx   . Malignant hyperthermia Neg Hx   . Pseudochol deficiency Neg Hx     History  Substance Use Topics  . Smoking status: Former Smoker -- 1.0 packs/day for 35 years    Types: Cigarettes    Quit date: 07/25/2010  . Smokeless tobacco: Not on file  . Alcohol Use: No      Review of Systems  Gastrointestinal: Positive for nausea, vomiting, abdominal pain and diarrhea.  Genitourinary: Negative.   Musculoskeletal: Negative for back pain.  All other systems reviewed and are negative.    Allergies  Review of patient's allergies indicates no known allergies.  Home Medications   Current Outpatient Rx  Name Route Sig Dispense Refill  . CETIRIZINE HCL 10 MG PO TABS Oral Take 10 mg by mouth daily.      Effie Berkshire 18-103 MCG/ACT IN AERO Inhalation Inhale 2 puffs into the lungs Twice daily.    . CYCLOBENZAPRINE HCL 10 MG PO TABS Oral Take 10 mg by mouth 3 (three) times daily as needed. Muscle Spasms    . DOXAZOSIN MESYLATE 8 MG PO TABS Oral Take 8 mg by mouth at bedtime.      . DULOXETINE HCL 60 MG PO CPEP Oral Take 60 mg by mouth daily.      Marland Kitchen FINASTERIDE 5  MG PO TABS Oral Take 5 mg by mouth daily.     Marland Kitchen FLUTICASONE FUROATE 27.5 MCG/SPRAY NA SUSP Nasal Place 2 sprays into the nose daily.      Marland Kitchen NEXIUM 40 MG PO CPDR Oral Take 40 mg by mouth daily before breakfast.     . OXYCODONE-ACETAMINOPHEN 7.5-325 MG PO TABS Oral Take 1 tablet by mouth every 4 (four) hours as needed. For pain     . GUMMI BEAR MULTIVITAMIN/MIN PO Oral Take 1 each by mouth daily.      Marland Kitchen PEG-KCL-NACL-NASULF-NA ASC-C 100 G PO SOLR Oral Take 1 kit (100 g total) by mouth once. As directed Please purchase 1 Fleets enema to use with the prep 1 kit 0    BP 130/96  Pulse 102  Temp 98 F (36.7 C) (Oral)  Ht 5\' 8"  (1.727 m)  Wt 209 lb (94.802 kg)  BMI 31.78 kg/m2  SpO2 99%  Physical Exam  Nursing note and vitals  reviewed. Constitutional: He is oriented to person, place, and time. He appears well-developed and well-nourished. No distress.  HENT:  Head: Normocephalic and atraumatic.  Eyes: Conjunctivae normal and EOM are normal.  Neck: Normal range of motion. Neck supple. No tracheal deviation present.  Cardiovascular: Normal rate, regular rhythm and normal heart sounds.   No murmur heard. Pulmonary/Chest: Effort normal. No respiratory distress. He has no wheezes. He has no rales.  Abdominal: He exhibits no distension. There is tenderness. There is no rebound and no guarding.       Mild tenderness to palpation in the bilateral lower quadrant  Musculoskeletal: Normal range of motion. He exhibits no edema and no tenderness.  Neurological: He is alert and oriented to person, place, and time. No sensory deficit.  Skin: Skin is warm and dry.  Psychiatric: He has a normal mood and affect. His behavior is normal.    ED Course  Procedures (including critical care time) 10:02: Provided intent to order labs, imaging of abd,and medications for treatment of nausea and pain. Patient and spouse agreeable.   11:22: Performed recheck. Provided that radiology suggested bowel obstruction. Given intent to CAT scan for further EVAL. Patient and family agreeable. Labs Reviewed  CBC WITH DIFFERENTIAL - Abnormal; Notable for the following:    WBC 14.4 (*)     Neutrophils Relative 84 (*)     Neutro Abs 12.1 (*)     Lymphocytes Relative 11 (*)     All other components within normal limits  COMPREHENSIVE METABOLIC PANEL - Abnormal; Notable for the following:    Glucose, Bld 149 (*)     Total Protein 8.7 (*)     All other components within normal limits  LIPASE, BLOOD   Ct Abdomen Pelvis W Contrast  06/01/2012  *RADIOLOGY REPORT*  Clinical Data: Abdominal pain, bowel obstruction  CT ABDOMEN AND PELVIS WITH CONTRAST  Technique:  Multidetector CT imaging of the abdomen and pelvis was performed following the standard  protocol during bolus administration of intravenous contrast.  Contrast: OMNIPAQUE IOHEXOL 300 MG/ML  SOLN  Comparison: Plain films 06/01/2012  Findings: There is a 5 mm pulmonary nodule in the inferior right middle lobe (image 4).  No focal hepatic lesion.  The  gallbladder, pancreas, spleen, adrenal glands, and kidneys normal.  Stomach is normal.  The duodenum and proximal small bowel are normal.  There is dilatation of the mid small bowel up to 4 cm. There is poor progression of contrast to the distal small bowel. There  is a caliber change with the  distal ileum near completely collapsed leading up to the terminal ileum.  No clear transition point is identified but felt likely be within the mid small bowel and potentially in the left lower quadrant.  Colon and rectosigmoid colon are normal.  Abdominal aorta is normal caliber.  No retroperitoneal periportal lymphadenopathy.  No free fluid the pelvis.  No intraperitoneal free air.  No pelvic lymphadenopathy.  The prostate gland bladder normal. Review of bone windows demonstrates no aggressive osseous lesions.  IMPRESSION:  1. Mild mechanical mid small bowel obstruction with no definitive transition point identified however there is a caliber change from the dilated in mid small bowel to the collapsed distal ileum. 2.  No evidence of pneumatosis or intraperitoneal free air. 3.  Small right lower lobe pulmonary nodule. If the patient is at high risk for bronchogenic carcinoma, follow-up chest CT at 6-12 months is recommended.  If the patient is at low risk for bronchogenic carcinoma, follow-up chest CT at 12 months is recommended.  This recommendation follows the consensus statement: Guidelines for Management of Small Pulmonary Nodules Detected on CT Scans: A Statement from the Fleischner Society as published in Radiology 2005; 237:395-400.  No findings conveyed to Dr. Judd Lien on 06/01/2012 at 1250 hours   Original Report Authenticated By: Genevive Bi, M.D.     Dg Abd Acute W/chest  06/01/2012  *RADIOLOGY REPORT*  Clinical Data: Vomiting and abdominal pain.  ACUTE ABDOMEN SERIES (ABDOMEN 2 VIEW & CHEST 1 VIEW)  Comparison: Two-view chest 04/19/2011.  Findings: Heart size is normal.  The lung volumes are low.  No focal airspace disease is present.  A cervical spine stimulator is evident.  Fluid levels are present within distended loops of small bowel. There is paucity of gas within the colon.  No free air is present. The axial skeleton is unremarkable.  IMPRESSION:  1.  Fluid levels within dilated loops of small bowel and a relative paucity of distal bowel gas is most compatible with a partial small bowel obstruction. 2.  No free air. 3.  No acute cardiopulmonary disease.   Original Report Authenticated By: Jamesetta Orleans. MATTERN, M.D.         No diagnosis found.    MDM  The xrays are suggestive of a psbo.  The ct seems to confirm this.  I have spoken with Dr. Leticia Penna who believes observation overnight is appropriate.  No ngt at this time.  Will place if emesis continues.    I personally performed the services described in this documentation, which was scribed in my presence. The recorded information has been reviewed and considered.            Geoffery Lyons, MD 06/01/12 1317  Geoffery Lyons, MD 06/04/12 7829

## 2012-06-02 NOTE — Progress Notes (Signed)
Pt discharged to home with wife. Pt. Is alert and oriented. Pt is hemodynamically stable. AVS reviewed with pt. Capable of re verbalizing medication regimen. Discharge plan appropriate and in place. Allena Earing 06/02/2012 6:15 PM

## 2012-06-02 NOTE — Progress Notes (Signed)
Pt has ambulated in halls multiple times today, is passing gas.  No c/o pain or N/V. Tolerated clear liquids at lunch and tolerated a regular diet well with no discomfort at dinner.  Dr Leticia Penna paged.

## 2012-06-10 NOTE — Discharge Summary (Signed)
Physician Discharge Summary  Patient ID: Douglas Davidson MRN: [id number] DOB/AGE: 1955-09-10 56 y.o.  Admit date: 06/01/2012 Discharge date: 06/02/2012  Admission Diagnoses: Small bowel obstruction  Discharge Diagnoses: The same Active Problems:  * No active hospital problems. *    Discharged Condition: stable  Hospital Course: Patient presented to [facility] emergency department with several days of increasing nausea vomiting and abdominal pain. Pain was very similar to what he experienced a previous bowel obstruction. Patient has had significant intra-abdominal surgeries and is a high-risk for adhesions. Patient states his bowel function had decreased in the last couple days. He denied any melena or hematochezia. He had a recent colonoscopy which was reported as normal other than a polyp. He was admitted for continued management with IV fluid hydration and bowel rest. His symptomatology actually improved quickly and noted increase amount of flatus. Patient actually had a bowel movement and was slowly advanced back on a diet. By the evening of 06/02/2012 patient was tolerating a regular diet. His symptomatology had resolved. He continued abnormal bowel function. Patient wished to be discharged.  Consults: None  Significant Diagnostic Studies: radiology: CT scan: Abdomen and pelvis  Treatments: IV hydration and bowel rest  Discharge Exam: Blood pressure 125/80, pulse 79, temperature 98.5 F (36.9 C), temperature source Oral, resp. rate 20, height 5\' 8"  (1.727 m), weight 94.802 kg (209 lb), SpO2 95.00%. General appearance: alert and no distress Resp: clear to auscultation bilaterally Cardio: regular rate and rhythm GI: soft, non-tender; bowel sounds normal; no masses,  no organomegaly  Disposition: 01-Home or Self Care  Discharge Orders    Future Orders Please Complete By Expires   Diet - low sodium heart healthy      Increase activity slowly      Discharge instructions      Comments:   Increase activity as tolerated.  Continue a low residual diet as discussed for 1-2 weeks.       Medication List     As of 06/10/2012 11:17 AM    TAKE these medications         cetirizine 10 MG tablet   Commonly known as: ZYRTEC   Take 10 mg by mouth daily.      COMBIVENT 18-103 MCG/ACT inhaler   Generic drug: albuterol-ipratropium   Inhale 2 puffs into the lungs. Cough, Shortness of breath      cyclobenzaprine 10 MG tablet   Commonly known as: FLEXERIL   Take 10 mg by mouth at bedtime. Muscle Spasms      doxazosin 8 MG tablet   Commonly known as: CARDURA   Take 8 mg by mouth at bedtime.      DULoxetine 60 MG capsule   Commonly known as: CYMBALTA   Take 60 mg by mouth daily.      finasteride 5 MG tablet   Commonly known as: PROSCAR   Take 5 mg by mouth daily.      fluticasone 27.5 MCG/SPRAY nasal spray   Commonly known as: VERAMYST   Place 2 sprays into the nose daily as needed. Nasal Congestion      GUMMI BEAR MULTIVITAMIN/MIN PO   Take 1 each by mouth daily.      NEXIUM 40 MG capsule   Generic drug: esomeprazole   Take 40 mg by mouth daily before breakfast.      oxyCODONE-acetaminophen 7.5-325 MG per tablet   Commonly known as: PERCOCET   Take 1 tablet by mouth every 4 (four) hours as needed. For  pain         Signed: Kaesyn Johnston C 06/10/2012, 11:17 AM

## 2012-06-10 NOTE — H&P (Signed)
Douglas Davidson is an 56 y.o. male.   Chief Complaint: Nausea vomiting and abdominal pain HPI: Patient states approximately 48 hours of increasing diffuse abdominal pain. Pain and nausea increased to the point her patient was concerned of possible bowel obstruction. Patient has had a previous history of a bowel obstruction 6 (very similar fashion. He is not had any bowel function over the last couple days with only some flatus. No change in stools prior with no melena or hematochezia. He denies any fevers or chills. No hematemesis. Appetite is poor.  Past Medical History  Diagnosis Date  . BPH (benign prostatic hypertrophy)   . Chronic back pain     & neck  . GERD (gastroesophageal reflux disease) 2003  . Hypertension   . COPD (chronic obstructive pulmonary disease)   . Anxiety   . Arthritis   . Spinal cord stimulator status     Past Surgical History  Procedure Date  . Abdominal surgery 1992    adhesions/?colon  . Appendectomy   . Shoulder surgery right shoulder    rotator cuff  . Tendon repair right wrist  . Polypectomy 08/24/2011    Procedure: POLYPECTOMY;  Surgeon: Corbin Ade, MD;  Location: [facility];  Service: Endoscopy;  Laterality: N/A;  colon polyps  . Spinal cord stimulator insertion     Family History  Problem Relation Age of Onset  . Prostate cancer Father     & brother  . COPD Father   . Stroke Father   . Coronary artery disease Father   . Lymphoma Mother   . Hypertension Mother   . Anesthesia problems Neg Hx   . Hypotension Neg Hx   . Malignant hyperthermia Neg Hx   . Pseudochol deficiency Neg Hx    Social History:  reports that he quit smoking about 22 months ago. His smoking use included Cigarettes. He has a 35 pack-year smoking history. He does not have any smokeless tobacco history on file. He reports that he does not drink alcohol or use illicit drugs.  Allergies: No Known Allergies  No prescriptions prior to admission    No results found for  this or any previous visit (from the past 48 hour(s)). No results found.  Review of Systems  Constitutional: Negative for fever and chills.  HENT: Negative.   Eyes: Negative.   Respiratory: Negative.   Cardiovascular: Negative.   Gastrointestinal: Positive for heartburn, nausea, vomiting, abdominal pain (diffuse colicky) and constipation. Negative for diarrhea, blood in stool and melena.  Genitourinary: Negative.   Musculoskeletal: Negative.   Skin: Negative.   Neurological: Negative.   Endo/Heme/Allergies: Negative.   Psychiatric/Behavioral: Negative.     Blood pressure 125/80, pulse 79, temperature 98.5 F (36.9 C), temperature source Oral, resp. rate 20, height 5\' 8"  (1.727 m), weight 94.802 kg (209 lb), SpO2 95.00%. Physical Exam  Constitutional: He is oriented to person, place, and time. He appears well-developed and well-nourished. No distress.  HENT:  Head: Normocephalic and atraumatic.  Eyes: Conjunctivae normal and EOM are normal. Pupils are equal, round, and reactive to light. No scleral icterus.  Neck: Normal range of motion. Neck supple. No tracheal deviation present. No thyromegaly present.  Cardiovascular: Normal rate, regular rhythm and normal heart sounds.   Respiratory: Effort normal and breath sounds normal. No respiratory distress. He has no wheezes.  GI: Soft. Bowel sounds are normal. He exhibits no distension and no mass. There is no tenderness. There is no rebound and no guarding.  Lymphadenopathy:  He has no cervical adenopathy.  Neurological: He is alert and oriented to person, place, and time.  Skin: Skin is warm and dry.     Assessment/Plan Small bowel obstruction. Conservative management with IV fluid hydration. Continue ambulation. Continue bowel rest. Continue n.p.o. Surgical indications were discussed with the patient. Patient understands planned management and is agreeable.  Chelbi Herber C 06/10/2012, 11:14 AM

## 2012-08-09 NOTE — Progress Notes (Signed)
UR Chart Review Completed  

## 2013-10-23 ENCOUNTER — Encounter (HOSPITAL_COMMUNITY): Payer: Self-pay | Admitting: Emergency Medicine

## 2013-10-23 ENCOUNTER — Emergency Department (HOSPITAL_COMMUNITY)
Admission: EM | Admit: 2013-10-23 | Discharge: 2013-10-23 | Disposition: A | Discharge status: Home / Self Care | Payer: Medicare HMO | Attending: Emergency Medicine | Admitting: Emergency Medicine

## 2013-10-23 ENCOUNTER — Emergency Department (HOSPITAL_COMMUNITY): Payer: Medicare HMO

## 2013-10-23 DIAGNOSIS — Z87891 Personal history of nicotine dependence: Secondary | ICD-10-CM | POA: Insufficient documentation

## 2013-10-23 DIAGNOSIS — K219 Gastro-esophageal reflux disease without esophagitis: Secondary | ICD-10-CM | POA: Insufficient documentation

## 2013-10-23 DIAGNOSIS — Y9389 Activity, other specified: Secondary | ICD-10-CM | POA: Insufficient documentation

## 2013-10-23 DIAGNOSIS — I10 Essential (primary) hypertension: Secondary | ICD-10-CM | POA: Insufficient documentation

## 2013-10-23 DIAGNOSIS — J4489 Other specified chronic obstructive pulmonary disease: Secondary | ICD-10-CM | POA: Insufficient documentation

## 2013-10-23 DIAGNOSIS — Z23 Encounter for immunization: Secondary | ICD-10-CM | POA: Insufficient documentation

## 2013-10-23 DIAGNOSIS — IMO0002 Reserved for concepts with insufficient information to code with codable children: Secondary | ICD-10-CM | POA: Insufficient documentation

## 2013-10-23 DIAGNOSIS — Z792 Long term (current) use of antibiotics: Secondary | ICD-10-CM | POA: Insufficient documentation

## 2013-10-23 DIAGNOSIS — J449 Chronic obstructive pulmonary disease, unspecified: Secondary | ICD-10-CM | POA: Insufficient documentation

## 2013-10-23 DIAGNOSIS — Z79899 Other long term (current) drug therapy: Secondary | ICD-10-CM | POA: Insufficient documentation

## 2013-10-23 DIAGNOSIS — L03119 Cellulitis of unspecified part of limb: Secondary | ICD-10-CM

## 2013-10-23 DIAGNOSIS — W268XXA Contact with other sharp object(s), not elsewhere classified, initial encounter: Secondary | ICD-10-CM | POA: Insufficient documentation

## 2013-10-23 DIAGNOSIS — M129 Arthropathy, unspecified: Secondary | ICD-10-CM | POA: Insufficient documentation

## 2013-10-23 DIAGNOSIS — Z87448 Personal history of other diseases of urinary system: Secondary | ICD-10-CM | POA: Insufficient documentation

## 2013-10-23 DIAGNOSIS — F411 Generalized anxiety disorder: Secondary | ICD-10-CM | POA: Insufficient documentation

## 2013-10-23 DIAGNOSIS — S61409A Unspecified open wound of unspecified hand, initial encounter: Secondary | ICD-10-CM | POA: Insufficient documentation

## 2013-10-23 DIAGNOSIS — Y92009 Unspecified place in unspecified non-institutional (private) residence as the place of occurrence of the external cause: Secondary | ICD-10-CM | POA: Insufficient documentation

## 2013-10-23 DIAGNOSIS — G8929 Other chronic pain: Secondary | ICD-10-CM | POA: Insufficient documentation

## 2013-10-23 MED ORDER — AMOXICILLIN-POT CLAVULANATE 875-125 MG PO TABS: 1.0000 | ORAL_TABLET | Freq: Two times a day (BID) | ORAL | Status: AC

## 2013-10-23 MED ORDER — AMOXICILLIN-POT CLAVULANATE 875-125 MG PO TABS
1.0000 | ORAL_TABLET | Freq: Once | ORAL | Status: AC
  Administered 2013-10-23: 1 via ORAL
  Filled 2013-10-23: qty 1

## 2013-10-23 MED ORDER — TETANUS-DIPHTH-ACELL PERTUSSIS 5-2.5-18.5 LF-MCG/0.5 IM SUSP
0.5000 mL | Freq: Once | INTRAMUSCULAR | Status: AC
  Administered 2013-10-23: 0.5 mL via INTRAMUSCULAR
  Filled 2013-10-23: qty 0.5

## 2013-10-23 NOTE — Discharge Instructions (Signed)
Cellulitis °Cellulitis is an infection of the skin and the tissue under the skin. The infected area is usually red and tender. This happens most often in the arms and lower legs. °HOME CARE  °· Take your antibiotic medicine as told. Finish the medicine even if you start to feel better. °· Keep the infected arm or leg raised (elevated). °· Put a warm cloth on the area up to 4 times per day. °· Only take medicines as told by your doctor. °· Keep all doctor visits as told. °GET HELP RIGHT AWAY IF:  °· You have a fever. °· You feel very sleepy. °· You throw up (vomit) or have watery poop (diarrhea). °· You feel sick and have muscle aches and pains. °· You see red streaks on the skin coming from the infected area. °· Your red area gets bigger or turns a dark color. °· Your bone or joint under the infected area is painful after the skin heals. °· Your infection comes back in the same area or different area. °· You have a puffy (swollen) bump in the infected area. °· You have new symptoms. °MAKE SURE YOU:  °· Understand these instructions. °· Will watch your condition. °· Will get help right away if you are not doing well or get worse. °Document Released: 01/10/2008 Document Revised: 01/23/2012 Document Reviewed: 10/09/2011 °ExitCare® Patient Information ©2014 [employer]. ° °

## 2013-10-23 NOTE — ED Notes (Signed)
Pain, swelling rt hand Hit hand against a nail when working in crawl space of house.   Small puncture present.

## 2013-10-25 ENCOUNTER — Emergency Department (HOSPITAL_COMMUNITY)
Admission: EM | Admit: 2013-10-25 | Discharge: 2013-10-25 | Disposition: A | Discharge status: Home / Self Care | Payer: Medicare HMO | Attending: Emergency Medicine | Admitting: Emergency Medicine

## 2013-10-25 ENCOUNTER — Encounter (HOSPITAL_COMMUNITY): Payer: Self-pay | Admitting: Emergency Medicine

## 2013-10-25 DIAGNOSIS — L03119 Cellulitis of unspecified part of limb: Secondary | ICD-10-CM

## 2013-10-25 DIAGNOSIS — Z87891 Personal history of nicotine dependence: Secondary | ICD-10-CM | POA: Insufficient documentation

## 2013-10-25 DIAGNOSIS — Z87448 Personal history of other diseases of urinary system: Secondary | ICD-10-CM | POA: Insufficient documentation

## 2013-10-25 DIAGNOSIS — J4489 Other specified chronic obstructive pulmonary disease: Secondary | ICD-10-CM | POA: Insufficient documentation

## 2013-10-25 DIAGNOSIS — L02519 Cutaneous abscess of unspecified hand: Secondary | ICD-10-CM | POA: Insufficient documentation

## 2013-10-25 DIAGNOSIS — Z5189 Encounter for other specified aftercare: Secondary | ICD-10-CM

## 2013-10-25 DIAGNOSIS — M129 Arthropathy, unspecified: Secondary | ICD-10-CM | POA: Insufficient documentation

## 2013-10-25 DIAGNOSIS — IMO0002 Reserved for concepts with insufficient information to code with codable children: Secondary | ICD-10-CM | POA: Insufficient documentation

## 2013-10-25 DIAGNOSIS — Z79899 Other long term (current) drug therapy: Secondary | ICD-10-CM | POA: Insufficient documentation

## 2013-10-25 DIAGNOSIS — Z4801 Encounter for change or removal of surgical wound dressing: Secondary | ICD-10-CM | POA: Insufficient documentation

## 2013-10-25 DIAGNOSIS — F411 Generalized anxiety disorder: Secondary | ICD-10-CM | POA: Insufficient documentation

## 2013-10-25 DIAGNOSIS — Z792 Long term (current) use of antibiotics: Secondary | ICD-10-CM | POA: Insufficient documentation

## 2013-10-25 DIAGNOSIS — G8929 Other chronic pain: Secondary | ICD-10-CM | POA: Insufficient documentation

## 2013-10-25 DIAGNOSIS — L03113 Cellulitis of right upper limb: Secondary | ICD-10-CM

## 2013-10-25 DIAGNOSIS — K219 Gastro-esophageal reflux disease without esophagitis: Secondary | ICD-10-CM | POA: Insufficient documentation

## 2013-10-25 DIAGNOSIS — J449 Chronic obstructive pulmonary disease, unspecified: Secondary | ICD-10-CM | POA: Insufficient documentation

## 2013-10-25 DIAGNOSIS — I10 Essential (primary) hypertension: Secondary | ICD-10-CM | POA: Insufficient documentation

## 2013-10-25 NOTE — ED Notes (Signed)
Pt was seen here Thursday for injury/infection to right hand. Pt had splint placed on wrist/forearm and was rx abx. Pt was advised to return ED today for recheck.

## 2013-10-25 NOTE — ED Notes (Signed)
Per Evalee Jefferson PA remove splint and apply Doran Durand splint. Applied without difficulty. Cms intact.

## 2013-10-25 NOTE — ED Notes (Signed)
nad noted prior to dc. Dc instructions reviewed and explained. Voiced understanding. Pt will f/u in 2 days back to ER as per instructions

## 2013-10-25 NOTE — Discharge Instructions (Signed)
Cellulitis Cellulitis is an infection of the skin and the tissue beneath it. The infected area is usually red and tender. Cellulitis occurs most often in the arms and lower legs.  CAUSES  Cellulitis is caused by bacteria that enter the skin through cracks or cuts in the skin. The most common types of bacteria that cause cellulitis are Staphylococcus and Streptococcus. SYMPTOMS   Redness and warmth.  Swelling.  Tenderness or pain.  Fever. DIAGNOSIS  Your caregiver can usually determine what is wrong based on a physical exam. Blood tests may also be done. TREATMENT  Treatment usually involves taking an antibiotic medicine. HOME CARE INSTRUCTIONS   Take your antibiotics as directed. Finish them even if you start to feel better.  Keep the infected arm or leg elevated to reduce swelling.  Apply a warm cloth to the affected area up to 4 times per day to relieve pain.  Only take over-the-counter or prescription medicines for pain, discomfort, or fever as directed by your caregiver.  Keep all follow-up appointments as directed by your caregiver. SEEK MEDICAL CARE IF:   You notice red streaks coming from the infected area.  Your red area gets larger or turns dark in color.  Your bone or joint underneath the infected area becomes painful after the skin has healed.  Your infection returns in the same area or another area.  You notice a swollen bump in the infected area.  You develop new symptoms. SEEK IMMEDIATE MEDICAL CARE IF:   You have a fever.  You feel very sleepy.  You develop vomiting or diarrhea.  You have a general ill feeling (malaise) with muscle aches and pains. MAKE SURE YOU:   Understand these instructions.  Will watch your condition.  Will get help right away if you are not doing well or get worse. Document Released: 05/03/2005 Document Revised: 01/23/2012 Document Reviewed: 10/09/2011 ExitCare Patient Information 2014 [employer].  

## 2013-10-26 NOTE — ED Provider Notes (Signed)
CSN: [id number]     Arrival date & time 10/23/13  2004 History   First MD Initiated Contact with Patient 10/23/13 2044     Chief Complaint  Patient presents with  . Hand Injury     (Consider location/radiation/quality/duration/timing/severity/associated sxs/prior Treatment) HPI Comments: Douglas Davidson is a 58 y.o. male who presents to the Emergency Department complaining of pain, redness and swelling of the dorsal right hand.  Symptoms began after a puncture wound to the dorsal hand from a rusty nail.  Patient reports scant bleeding.  He cleaned the area with soap and water prior to arrival.  States swelling was immediate.  Reports having pain with extension and flexion of the index finger.  He denies fever, chills, pain to the wrist, red streaks or numbness of the hand or fingers.  Last Td is unknown.    The history is provided by the patient.    Past Medical History  Diagnosis Date  . BPH (benign prostatic hypertrophy)   . Chronic back pain     & neck  . GERD (gastroesophageal reflux disease) 2003  . Hypertension   . COPD (chronic obstructive pulmonary disease)   . Anxiety   . Arthritis   . Spinal cord stimulator status    Past Surgical History  Procedure Laterality Date  . Abdominal surgery  1992    adhesions/?colon  . Appendectomy    . Shoulder surgery  right shoulder    rotator cuff  . Tendon repair  right wrist  . Polypectomy  08/24/2011    Procedure: POLYPECTOMY;  Surgeon: Daneil Dolin, MD;  Location: [facility];  Service: Endoscopy;  Laterality: N/A;  colon polyps  . Spinal cord stimulator insertion     Family History  Problem Relation Age of Onset  . Prostate cancer Father     & brother  . COPD Father   . Stroke Father   . Coronary artery disease Father   . Lymphoma Mother   . Hypertension Mother   . Anesthesia problems Neg Hx   . Hypotension Neg Hx   . Malignant hyperthermia Neg Hx   . Pseudochol deficiency Neg Hx    History  Substance Use Topics  .  Smoking status: Former Smoker -- 1.00 packs/day for 35 years    Types: Cigarettes    Quit date: 07/25/2010  . Smokeless tobacco: Not on file  . Alcohol Use: No    Review of Systems  Constitutional: Negative for fever and chills.  Genitourinary: Negative for dysuria and difficulty urinating.  Musculoskeletal: Positive for arthralgias. Negative for joint swelling.  Skin: Positive for color change and wound.       Puncture wound right dorsal hand  All other systems reviewed and are negative.      Allergies  Review of patient's allergies indicates no known allergies.  Home Medications   Current Outpatient Rx  Name  Route  Sig  Dispense  Refill  . amoxicillin-clavulanate (AUGMENTIN) 875-125 MG per tablet   Oral   Take 1 tablet by mouth 2 (two) times daily. For 10 days   20 tablet   0   . cetirizine (ZYRTEC) 10 MG tablet   Oral   Take 10 mg by mouth daily.           . clotrimazole-betamethasone (LOTRISONE) cream   Topical   Apply 1 application topically 2 (two) times daily.         [name] 18-103 MCG/ACT inhaler   Inhalation  Inhale 2 puffs into the lungs every 6 (six) hours as needed. Cough, Shortness of breath         . CRESTOR 10 MG tablet   Oral   Take 1 tablet by mouth at bedtime.         . cyclobenzaprine (FLEXERIL) 10 MG tablet   Oral   Take 10 mg by mouth at bedtime as needed for muscle spasms.          Marland Kitchen doxazosin (CARDURA) 8 MG tablet   Oral   Take 8 mg by mouth at bedtime.          Marland Kitchen doxycycline (VIBRAMYCIN) 100 MG capsule   Oral   Take 1 capsule by mouth daily.         . DULoxetine (CYMBALTA) 60 MG capsule   Oral   Take 60 mg by mouth at bedtime.          . finasteride (PROSCAR) 5 MG tablet   Oral   Take 5 mg by mouth daily.          . fluticasone (FLONASE) 50 MCG/ACT nasal spray   Each Nare   Place 2 sprays into both nostrils daily.         Marland Kitchen lidocaine (LIDODERM) 5 %   Transdermal   Place 1 patch onto the skin  every other day.         Marland Kitchen NEXIUM 40 MG capsule   Oral   Take 40 mg by mouth daily before breakfast.          . oxyCODONE-acetaminophen (PERCOCET) 7.5-325 MG per tablet   Oral   Take 1 tablet by mouth every 4 (four) hours as needed. For pain          . Pediatric Multivit-Minerals-C (GUMMI BEAR MULTIVITAMIN/MIN PO)   Oral   Take 1 each by mouth daily.            BP 124/76  Pulse 99  Temp(Src) 98.2 F (36.8 C) (Oral)  Resp 20  Ht 5\' 8"  (1.727 m)  Wt 209 lb (94.802 kg)  BMI 31.79 kg/m2  SpO2 96% Physical Exam  Nursing note and vitals reviewed. Constitutional: He is oriented to person, place, and time. He appears well-developed and well-nourished. No distress.  HENT:  Head: Normocephalic and atraumatic.  Cardiovascular: Normal rate, regular rhythm, normal heart sounds and intact distal pulses.   No murmur heard. Pulmonary/Chest: Effort normal and breath sounds normal. No respiratory distress.  Musculoskeletal: Normal range of motion. He exhibits no edema.       Right hand: He exhibits tenderness and swelling. He exhibits normal range of motion, no bony tenderness, normal two-point discrimination, normal capillary refill, no deformity and no laceration. Normal sensation noted. Normal strength noted.       Hands: Neurological: He is alert and oriented to person, place, and time. He exhibits normal muscle tone. Coordination normal.  Skin: Skin is warm and dry.  Pin point puncture wound to dorsal right hand over [place] of the index finger.  Moderate STS and mild erythema.  Radial pulse brisk, distal sensation, CR< 2 sec.  Pt has full ROM of the finger.    ED Course  Procedures (including critical care time) Labs Review Labs Reviewed - No data to display Imaging Review Dg Hand Complete Right  10/23/2013   CLINICAL DATA:  Injury.  Nail puncture.  EXAM: RIGHT HAND - COMPLETE 3+ VIEW  COMPARISON:  None.  FINDINGS: No foreign bodies. Fractures noted  along the posterior aspect of  the proximal epiphysis of the distal phalanx of the right second digit. Fracture slightly displaced.  IMPRESSION: Slightly displaced fracture along the posterior ulnar aspect of the proximal epiphysis of the distal phalanx of the right second digit. No foreign body.   Electronically Signed   By: Marcello Moores  Register   On: 10/23/2013 21:06    EKG Interpretation None      MDM   Final diagnoses:  Cellulitis of hand    TdaP given  Pt has full ROM of the finger, but injury is concerning for infection and possible tendon involvement.  I will prescribe Augmentin and pt agrees to return in 2 days for recheck.   Also advised to return sooner if redness, pain or swelling are increasing.  Puncture wound irrigated with saline.  Volar splint applied.  Remains NV intact.  Pain improved  VSS.  Pt is well appearing and stable for discharge.  The patient appears reasonably screened and/or stabilized for discharge and I doubt any other medical condition or other [facility] requiring further screening, evaluation, or treatment in the ED at this time prior to discharge.     Mikal Blasdell L. Vanessa [place], PA-C 10/26/13 1801

## 2013-10-27 NOTE — ED Provider Notes (Signed)
Medical screening examination/treatment/procedure(s) were performed by non-physician practitioner and as supervising physician I was immediately available for consultation/collaboration.   EKG Interpretation None        Tanna Furry, MD 10/27/13 [phone]

## 2013-10-28 NOTE — ED Provider Notes (Signed)
CSN: [id number]     Arrival date & time 10/25/13  15 History   First MD Initiated Contact with Patient 10/25/13 1358     Chief Complaint  Patient presents with  . Wound Check     (Consider location/radiation/quality/duration/timing/severity/associated sxs/prior Treatment) HPI Comments: ETAN VASUDEVAN is a 58 y.o. Male presenting for a recheck of his right hand infection which occurred 2 days ago when he punctured the dorsal hand with rusty nail while working in his crawl space.  He had immediate pain, and swelling and developed pain with flexion and extension of his index finger.  He was placed on augmentin and placed in a volar splint to minimize flexion of his fingers.  He reports near resolution of the swelling and still with soreness with ROM of his index finger but much improved.  He has had no red streaking, no fevers and endorses being compliant with his antibiotic.       The history is provided by the patient.    Past Medical History  Diagnosis Date  . BPH (benign prostatic hypertrophy)   . Chronic back pain     & neck  . GERD (gastroesophageal reflux disease) 2003  . Hypertension   . COPD (chronic obstructive pulmonary disease)   . Anxiety   . Arthritis   . Spinal cord stimulator status    Past Surgical History  Procedure Laterality Date  . Abdominal surgery  1992    adhesions/?colon  . Appendectomy    . Shoulder surgery  right shoulder    rotator cuff  . Tendon repair  right wrist  . Polypectomy  08/24/2011    Procedure: POLYPECTOMY;  Surgeon: Daneil Dolin, MD;  Location: [facility];  Service: Endoscopy;  Laterality: N/A;  colon polyps  . Spinal cord stimulator insertion     Family History  Problem Relation Age of Onset  . Prostate cancer Father     & brother  . COPD Father   . Stroke Father   . Coronary artery disease Father   . Lymphoma Mother   . Hypertension Mother   . Anesthesia problems Neg Hx   . Hypotension Neg Hx   . Malignant hyperthermia  Neg Hx   . Pseudochol deficiency Neg Hx    History  Substance Use Topics  . Smoking status: Former Smoker -- 1.00 packs/day for 35 years    Types: Cigarettes    Quit date: 07/25/2010  . Smokeless tobacco: Not on file  . Alcohol Use: No    Review of Systems  Constitutional: Negative for fever.  Musculoskeletal: Positive for arthralgias. Negative for joint swelling and myalgias.  Skin: Positive for wound. Negative for color change.  Neurological: Negative for weakness and numbness.      Allergies  Review of patient's allergies indicates no known allergies.  Home Medications   Current Outpatient Rx  Name  Route  Sig  Dispense  Refill  . amoxicillin-clavulanate (AUGMENTIN) 875-125 MG per tablet   Oral   Take 1 tablet by mouth 2 (two) times daily. For 10 days   20 tablet   0   . cetirizine (ZYRTEC) 10 MG tablet   Oral   Take 10 mg by mouth daily.           . clotrimazole-betamethasone (LOTRISONE) cream   Topical   Apply 1 application topically 2 (two) times daily.         [name] 18-103 MCG/ACT inhaler   Inhalation   Inhale 2 puffs  into the lungs every 6 (six) hours as needed. Cough, Shortness of breath         . CRESTOR 10 MG tablet   Oral   Take 1 tablet by mouth at bedtime.         . cyclobenzaprine (FLEXERIL) 10 MG tablet   Oral   Take 10 mg by mouth at bedtime as needed for muscle spasms.          Marland Kitchen doxazosin (CARDURA) 8 MG tablet   Oral   Take 8 mg by mouth at bedtime.          Marland Kitchen doxycycline (VIBRAMYCIN) 100 MG capsule   Oral   Take 1 capsule by mouth daily.         . DULoxetine (CYMBALTA) 60 MG capsule   Oral   Take 60 mg by mouth at bedtime.          . finasteride (PROSCAR) 5 MG tablet   Oral   Take 5 mg by mouth daily.          . fluticasone (FLONASE) 50 MCG/ACT nasal spray   Each Nare   Place 2 sprays into both nostrils daily.         Marland Kitchen NEXIUM 40 MG capsule   Oral   Take 40 mg by mouth daily before breakfast.           . oxyCODONE-acetaminophen (PERCOCET) 7.5-325 MG per tablet   Oral   Take 1 tablet by mouth every 4 (four) hours as needed. For pain          . Pediatric Multivit-Minerals-C (GUMMI BEAR MULTIVITAMIN/MIN PO)   Oral   Take 1 each by mouth daily.           Marland Kitchen lidocaine (LIDODERM) 5 %   Transdermal   Place 1 patch onto the skin every other day.          BP 121/81  Pulse 108  Temp(Src) 98.6 F (37 C) (Oral)  Resp 20  SpO2 98% Physical Exam  Constitutional: He appears well-developed and well-nourished.  HENT:  Head: Atraumatic.  Cardiovascular:  Pulses equal bilaterally  Musculoskeletal: Normal range of motion. He exhibits tenderness.  Neurological: He is alert. He has normal strength. He displays normal reflexes. No sensory deficit.  Skin: Skin is warm and dry.  Small amount of nontender edema across right mcps (just outside edge of ace wrap).  No edema dorsal hand at area of documented edema at last visit.  No erythema or red streaking,  Puncture wound intact.  Distal cap refill less than 3 [place]. FROM of fingers.  Psychiatric: He has a normal mood and affect.    ED Course  Procedures (including critical care time) Labs Review Labs Reviewed - No data to display Imaging Review No results found.   EKG Interpretation None      MDM   Final diagnoses:  Visit for wound check  Cellulitis of right hand    mcp edema probably due to splinting/ace wrap.  This was replaced with a jones dressing.  Encouraged continued abx until completed, elevation, recheck here in 2 days to confirm continued resolution of this injury.  Pt was discussed with Dr. Lacinda Axon prior to dc home.    Evalee Jefferson, PA-C 10/28/13 1144

## 2013-10-29 NOTE — ED Provider Notes (Signed)
Medical screening examination/treatment/procedure(s) were conducted as a shared visit with non-physician practitioner(s) and myself.  I personally evaluated the patient during the encounter.   EKG Interpretation None     No worsening/slight improvement of right hand infection. Continue antibiotics. Recheck in 2 days  Nat Christen, MD 10/29/13 1221

## 2013-12-20 ENCOUNTER — Other Ambulatory Visit: Payer: Self-pay | Admitting: Physician Assistant

## 2014-09-02 IMAGING — CR DG HAND COMPLETE 3+V*R*
3 series · 3 of 3 positions shown · non-contrast
Comparison: None.

CLINICAL DATA: Injury.  Nail puncture.

EXAM:
RIGHT HAND - COMPLETE 3+ VIEW

[view not recorded (1 of 3)]
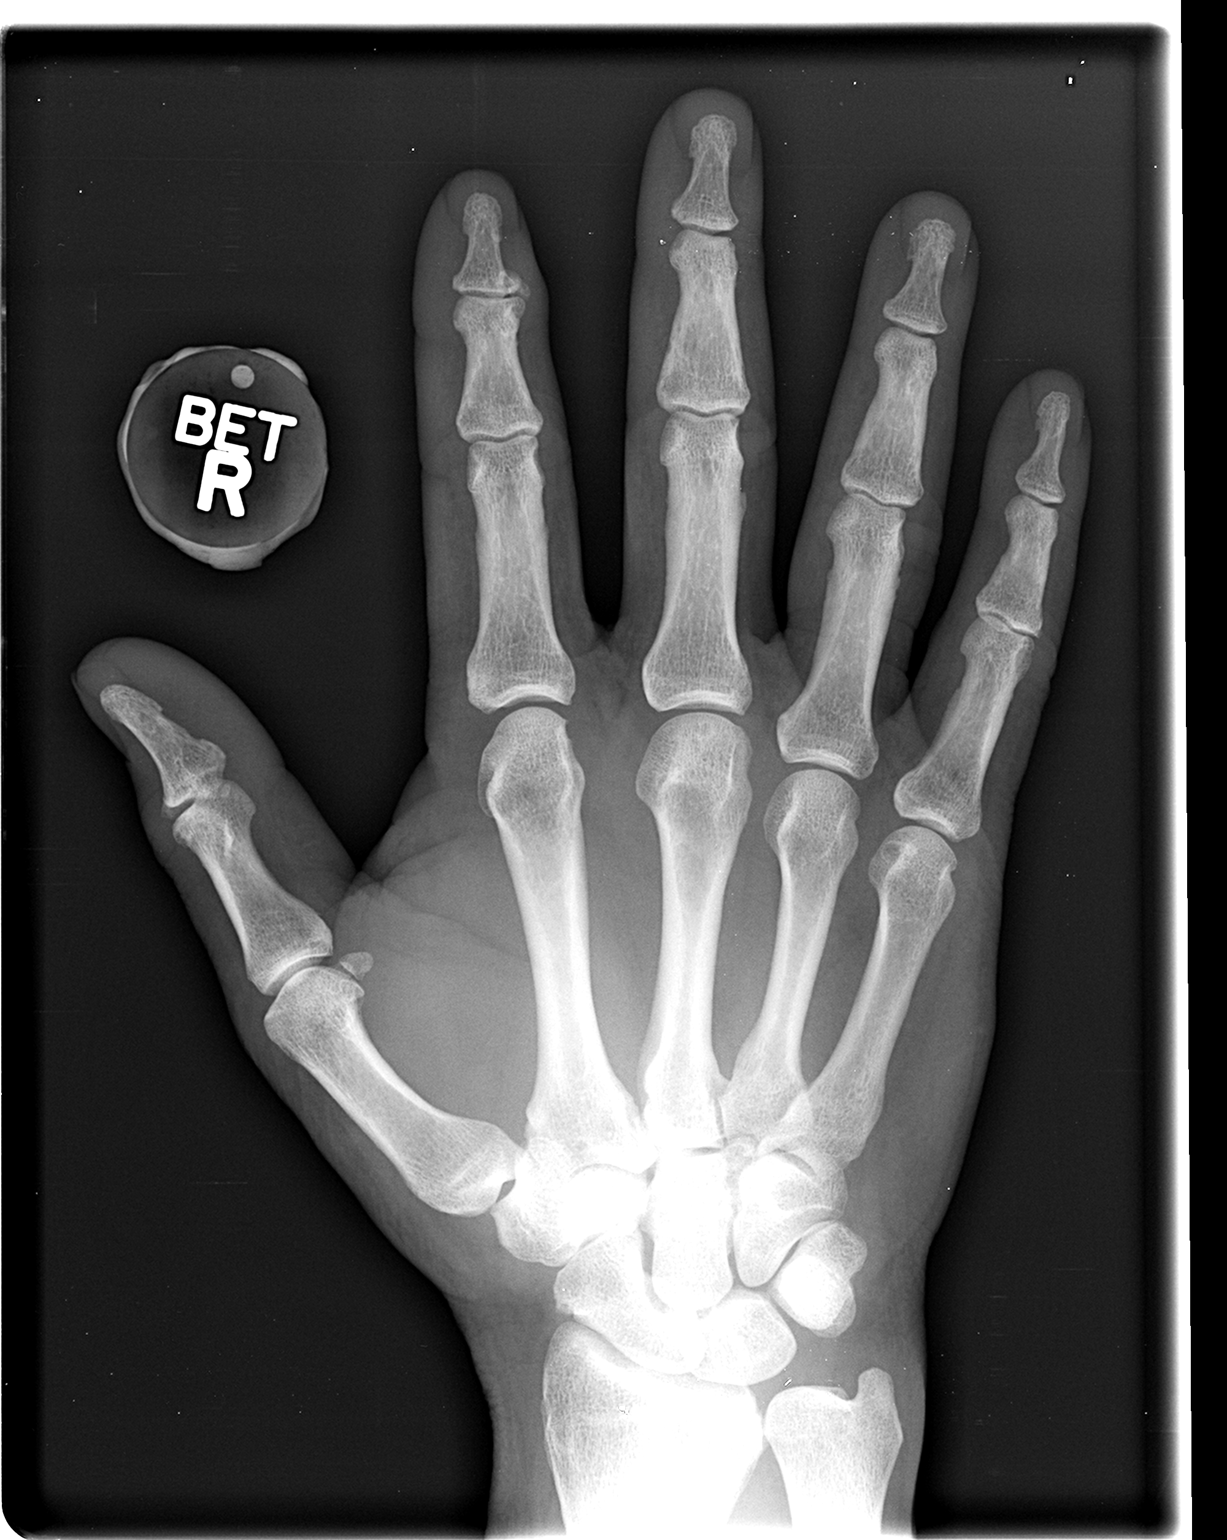

[view not recorded (2 of 3)]
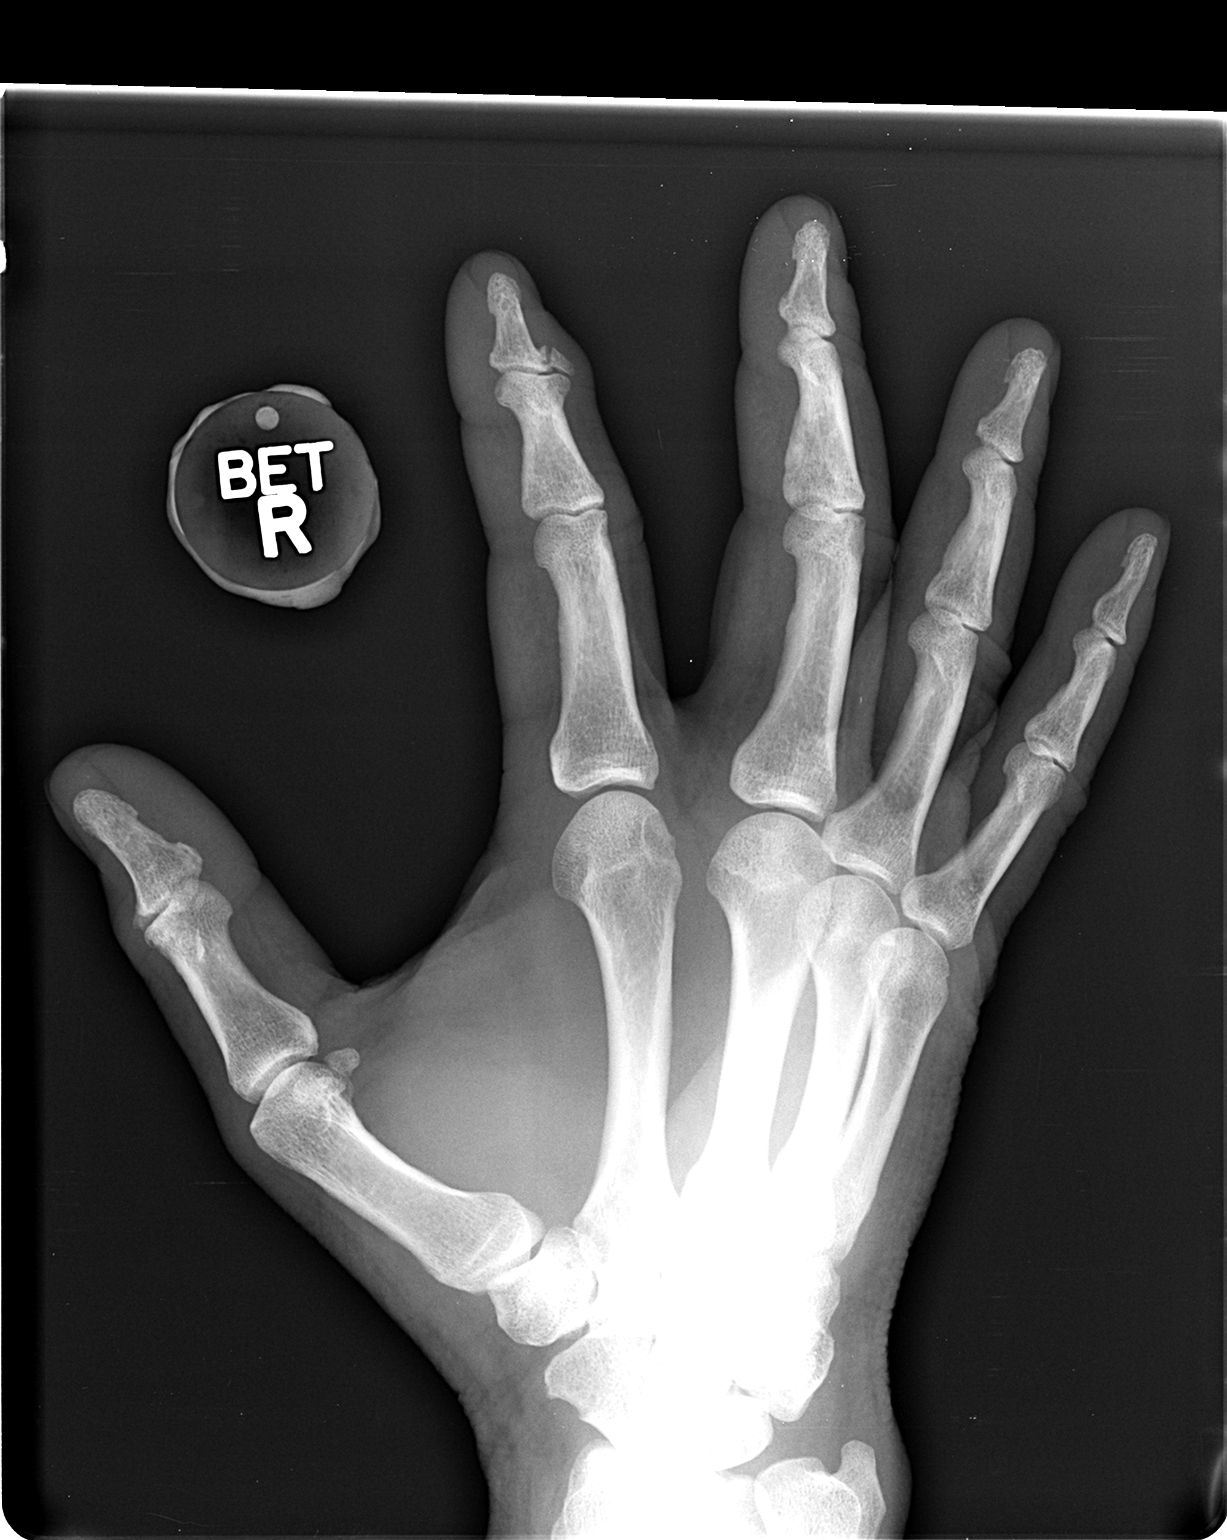

[view not recorded (3 of 3)]
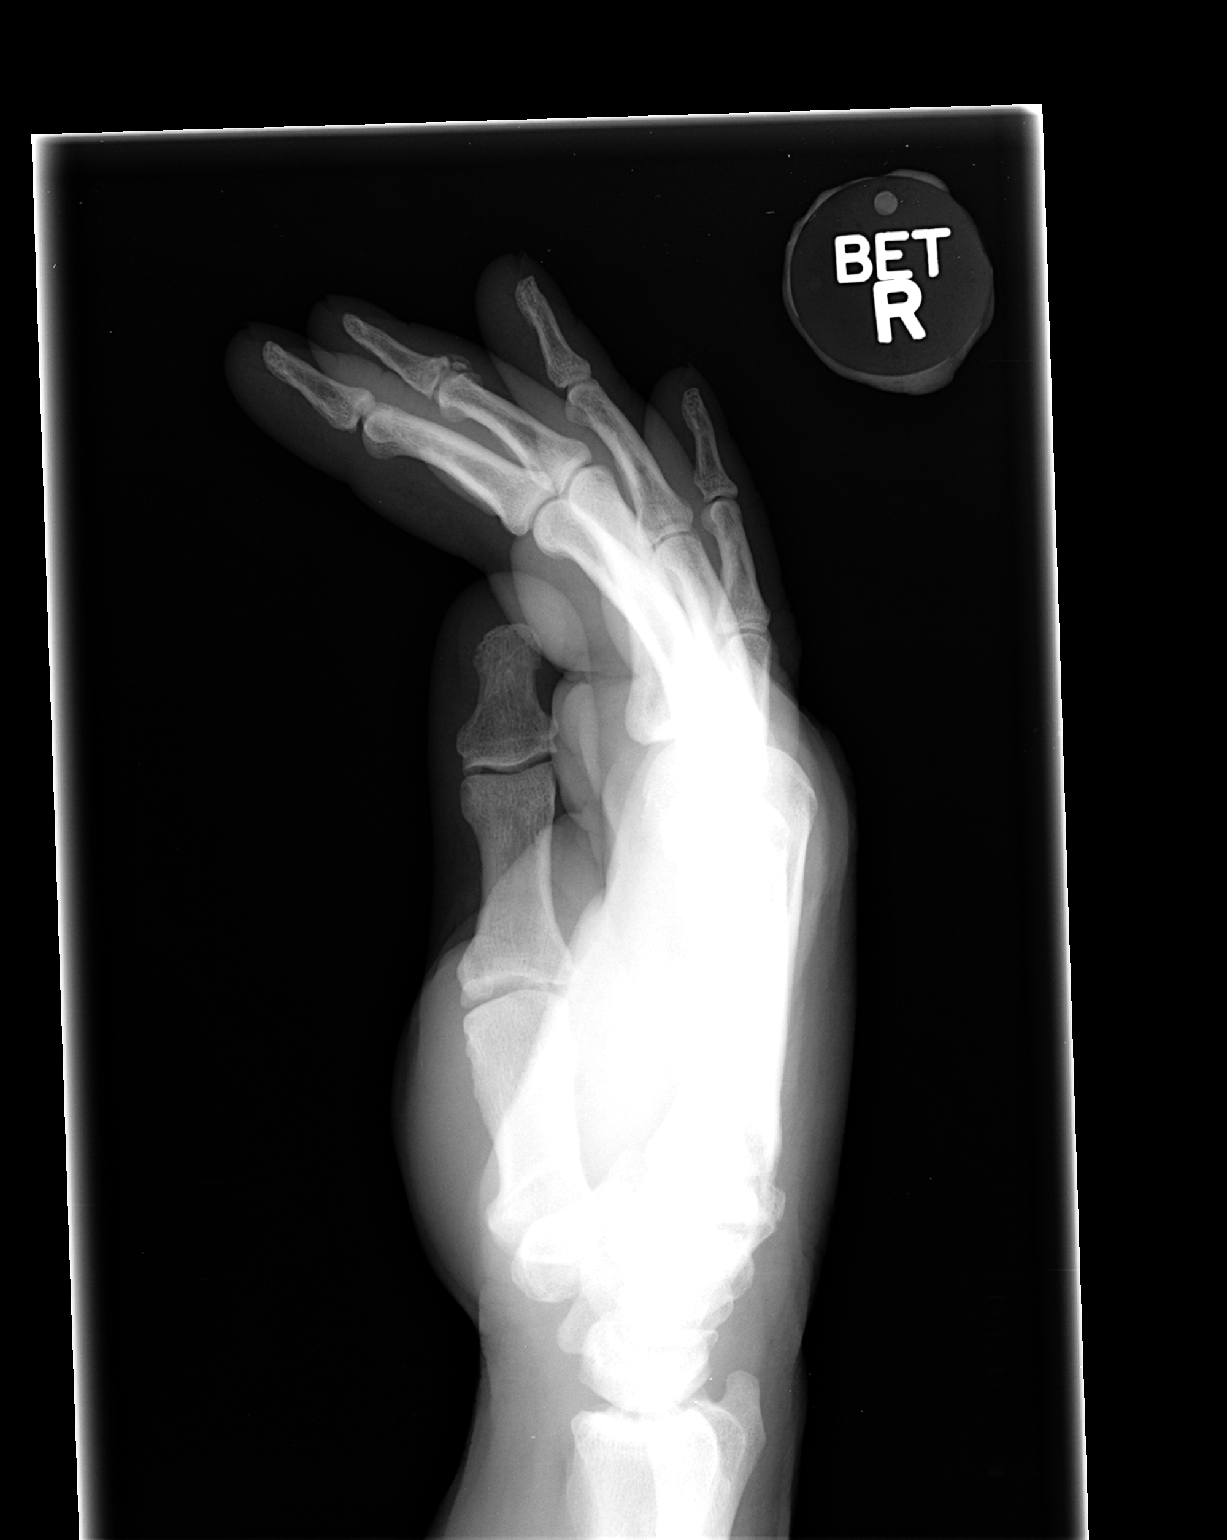

[3 of 3 positions shown; findings below may reference images not displayed]

FINDINGS: No foreign bodies. Fractures noted along the posterior aspect of the
proximal epiphysis of the distal phalanx of the right second digit.
Fracture slightly displaced.
IMPRESSION: Slightly displaced fracture along the posterior ulnar aspect of the
proximal epiphysis of the distal phalanx of the right second digit.
No foreign body.

## 2015-10-20 DIAGNOSIS — M47812 Spondylosis without myelopathy or radiculopathy, cervical region: Secondary | ICD-10-CM | POA: Insufficient documentation

## 2015-10-20 DIAGNOSIS — M792 Neuralgia and neuritis, unspecified: Secondary | ICD-10-CM | POA: Insufficient documentation

## 2016-07-18 ENCOUNTER — Encounter: Payer: Self-pay | Admitting: Internal Medicine

## 2017-07-12 DIAGNOSIS — Z87891 Personal history of nicotine dependence: Secondary | ICD-10-CM | POA: Insufficient documentation

## 2017-07-12 DIAGNOSIS — R9389 Abnormal findings on diagnostic imaging of other specified body structures: Secondary | ICD-10-CM | POA: Insufficient documentation

## 2017-07-12 DIAGNOSIS — R0602 Shortness of breath: Secondary | ICD-10-CM | POA: Insufficient documentation

## 2017-07-22 DIAGNOSIS — R911 Solitary pulmonary nodule: Secondary | ICD-10-CM | POA: Insufficient documentation

## 2017-07-22 DIAGNOSIS — K760 Fatty (change of) liver, not elsewhere classified: Secondary | ICD-10-CM | POA: Insufficient documentation

## 2019-10-15 ENCOUNTER — Ambulatory Visit: Payer: Medicare HMO | Admitting: Sports Medicine

## 2019-10-15 ENCOUNTER — Other Ambulatory Visit: Payer: Self-pay | Admitting: Sports Medicine

## 2019-10-15 ENCOUNTER — Encounter: Payer: Self-pay | Admitting: Sports Medicine

## 2019-10-15 ENCOUNTER — Ambulatory Visit (INDEPENDENT_AMBULATORY_CARE_PROVIDER_SITE_OTHER): Payer: Medicare HMO

## 2019-10-15 ENCOUNTER — Other Ambulatory Visit: Payer: Self-pay

## 2019-10-15 DIAGNOSIS — R7309 Other abnormal glucose: Secondary | ICD-10-CM | POA: Insufficient documentation

## 2019-10-15 DIAGNOSIS — M79671 Pain in right foot: Secondary | ICD-10-CM

## 2019-10-15 DIAGNOSIS — M5412 Radiculopathy, cervical region: Secondary | ICD-10-CM | POA: Insufficient documentation

## 2019-10-15 DIAGNOSIS — G8929 Other chronic pain: Secondary | ICD-10-CM | POA: Insufficient documentation

## 2019-10-15 DIAGNOSIS — T148XXA Other injury of unspecified body region, initial encounter: Secondary | ICD-10-CM

## 2019-10-15 DIAGNOSIS — E785 Hyperlipidemia, unspecified: Secondary | ICD-10-CM | POA: Insufficient documentation

## 2019-10-15 DIAGNOSIS — F419 Anxiety disorder, unspecified: Secondary | ICD-10-CM | POA: Insufficient documentation

## 2019-10-15 DIAGNOSIS — J069 Acute upper respiratory infection, unspecified: Secondary | ICD-10-CM | POA: Insufficient documentation

## 2019-10-15 DIAGNOSIS — R05 Cough: Secondary | ICD-10-CM | POA: Insufficient documentation

## 2019-10-15 DIAGNOSIS — J449 Chronic obstructive pulmonary disease, unspecified: Secondary | ICD-10-CM | POA: Insufficient documentation

## 2019-10-15 DIAGNOSIS — J019 Acute sinusitis, unspecified: Secondary | ICD-10-CM | POA: Insufficient documentation

## 2019-10-15 DIAGNOSIS — B349 Viral infection, unspecified: Secondary | ICD-10-CM | POA: Insufficient documentation

## 2019-10-15 DIAGNOSIS — L247 Irritant contact dermatitis due to plants, except food: Secondary | ICD-10-CM | POA: Insufficient documentation

## 2019-10-15 DIAGNOSIS — D492 Neoplasm of unspecified behavior of bone, soft tissue, and skin: Secondary | ICD-10-CM | POA: Insufficient documentation

## 2019-10-15 DIAGNOSIS — N400 Enlarged prostate without lower urinary tract symptoms: Secondary | ICD-10-CM | POA: Insufficient documentation

## 2019-10-15 DIAGNOSIS — R059 Cough, unspecified: Secondary | ICD-10-CM | POA: Insufficient documentation

## 2019-10-15 NOTE — Progress Notes (Signed)
Subjective: STEPEHN MCMANIGAL is a 64 y.o. male patient seen in office for evaluation of puncture wound to the right heel patient reports 3 weeks ago he stepped on a nail that pierced him through his tennis shoe and since he has had constant throbbing pain had 1 round of antibiotics as provided by PCP but it still aches a lot with a lot of pressure reports that it was really swollen the first few days now the swelling has went down but still hurts especially when attempting to walk.  Patient has used a Band-Aid to the area and has shifted a lot of weight to the ball of his foot to avoid putting pressure on the heel due to the pain.  Patient also reports that he was recently diagnosed few weeks ago with diabetes and considered diabetic type II with last A1c recorded at 6.  Patient denies any numbness tingling or burning sensation or any other pains outside of the right heel.  Patient reports that he is up-to-date on his tetanus shot.  Review of Systems  All other systems reviewed and are negative.   Patient Active Problem List   Diagnosis Date Noted  . Acute sinusitis 10/15/2019  . Acute upper respiratory infection 10/15/2019  . Acute viral disease 10/15/2019  . Anxiety 10/15/2019  . Benign prostatic hyperplasia 10/15/2019  . Blood glucose abnormal 10/15/2019  . Cervical radiculitis 10/15/2019  . Chronic obstructive lung disease ([place]) 10/15/2019  . Chronic pain 10/15/2019  . Cough 10/15/2019  . Hyperlipidemia 10/15/2019  . Irritant contact dermatitis due to plants, except food 10/15/2019  . 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 Hepatic steatosis 07/22/2017  . Pulmonary nodule 07/22/2017  . Abnormal chest x-ray 07/12/2017  . Former smoker 07/12/2017  . Shortness of breath 07/12/2017  . Neuropathic pain 10/20/2015  . 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 GERD (gastroesophageal reflux disease) 07/26/2011  . Encounter for screening colonoscopy  07/26/2011   Current Outpatient Medications on File Prior to Visit  Medication Sig Dispense Refill  . amoxicillin-clavulanate (AUGMENTIN) 875-125 MG per tablet Take 1 tablet by mouth 2 (two) times daily. For 10 days 20 tablet 0  . cetirizine (ZYRTEC) 10 MG tablet Take 10 mg by mouth daily.      . clotrimazole-betamethasone (LOTRISONE) cream Apply 1 application topically 2 (two) times daily.    [name] 18-103 MCG/ACT inhaler Inhale 2 puffs into the lungs every 6 (six) hours as needed. Cough, Shortness of breath    . CRESTOR 10 MG tablet Take 1 tablet by mouth at bedtime.    . cyclobenzaprine (FLEXERIL) 10 MG tablet Take 10 mg by mouth at bedtime as needed for muscle spasms.     Marland Kitchen doxazosin (CARDURA) 8 MG tablet Take 8 mg by mouth at bedtime.     Marland Kitchen doxycycline (VIBRAMYCIN) 100 MG capsule Take 1 capsule by mouth daily.    . DULoxetine (CYMBALTA) 60 MG capsule Take 60 mg by mouth at bedtime.     . finasteride (PROSCAR) 5 MG tablet Take 5 mg by mouth daily.     . fluticasone (FLONASE) 50 MCG/ACT nasal spray Place 2 sprays into both nostrils daily.    Marland Kitchen lidocaine (LIDODERM) 5 % Place 1 patch onto the skin every other day.    Marland Kitchen NEXIUM 40 MG capsule Take 40 mg by mouth daily before breakfast.     . oxyCODONE-acetaminophen (PERCOCET) 7.5-325 MG per tablet Take 1 tablet by mouth every 4 (  four) hours as needed. For pain     . Pediatric Multivit-Minerals-C (GUMMI BEAR MULTIVITAMIN/MIN PO) Take 1 each by mouth daily.       No current facility-administered medications on file prior to visit.   No Known Allergies  No results found for this or any previous visit (from the past 2160 hour(s)).  Objective: There were no vitals filed for this visit.  General: Patient is awake, alert, oriented x 3 and in no acute distress.  Dermatology: Skin is warm and dry bilateral with a small pinpoint puncture wound to the central aspect of the right heel with dried blood once debrided there was no open wound noted  underneath this area.  There is no malodor, no active drainage, no erythema, mild focal right heel edema. No other acute signs of infection.   Vascular: Dorsalis Pedis pulse = 2/4 Bilateral,  Posterior Tibial pulse = 1/4 Bilateral,  Capillary Fill Time < 5 seconds  Neurologic: Protective sensation intact bilateral with Semmes Weinstein monofilament vibratory intact bilateral with tuning fork.  Musculosketal: There is mild pain to palpation to the bottom of the right heel at the puncture wound site.  No acute osseous findings noted.  Xrays, Right foot: No retained foreign body, no bony destruction suggestive of osteomyelitis. No gas in soft tissues.   No results for input(s): GRAMSTAIN, LABORGA in the last 8760 hours.  Assessment and Plan:  Problem List Items Addressed This Visit    None    Visit Diagnoses    Puncture wound    -  Primary   Pain of right heel          -Examined patient and discussed the progression of the healing of the puncture wound -Xrays reviewed -Mechanically debrided keratotic skin at area of puncture wound revealing no opening applied antibiotic cream and Band-Aid and advised patient to continue with the same daily -At this time no additional antibiotics are needed since puncture wound has healed however advised patient that likely he is suffering with soft tissue injury and that is why the heel is feel pretty painful -Dispensed cam boot for patient to wear for the next 2 weeks -Advised patient to ice once to twice daily at the affected area to help with pain and inflammation -Patient to return to office in 2 weeks for follow up care and evaluation or sooner if problems arise.  If he is doing better we will transition from Cam boot to normal shoe with heel lift.  Landis Martins, DPM

## 2019-10-29 ENCOUNTER — Other Ambulatory Visit: Payer: Self-pay

## 2019-10-29 ENCOUNTER — Other Ambulatory Visit: Payer: Self-pay | Admitting: Sports Medicine

## 2019-10-29 ENCOUNTER — Encounter: Payer: Self-pay | Admitting: Sports Medicine

## 2019-10-29 ENCOUNTER — Ambulatory Visit (INDEPENDENT_AMBULATORY_CARE_PROVIDER_SITE_OTHER): Payer: Medicare HMO | Admitting: Sports Medicine

## 2019-10-29 DIAGNOSIS — M79671 Pain in right foot: Secondary | ICD-10-CM

## 2019-10-29 DIAGNOSIS — T148XXA Other injury of unspecified body region, initial encounter: Secondary | ICD-10-CM

## 2019-10-29 MED ORDER — TRIAMCINOLONE ACETONIDE 10 MG/ML IJ SUSP
10.0000 mg | Freq: Once | INTRAMUSCULAR | Status: AC
  Administered 2019-10-29: 10 mg

## 2019-10-29 NOTE — Progress Notes (Addendum)
Subjective: Douglas Davidson is a 64 y.o. male patient seen in office for evaluation of puncture/exploration wound on the bottom of the heel.  Patient admits that there is still some pain at the medial and lateral sides of the heel no pain plantarly at the previous puncture wound that has now healed.  Patient denies nausea/fever/vomiting/chills/night sweats/shortness of breath/pain. Patient has no other pedal complaints at this time.  Patient Active Problem List   Diagnosis Date Noted  . Acute sinusitis 10/15/2019  . Acute upper respiratory infection 10/15/2019  . Acute viral disease 10/15/2019  . Anxiety 10/15/2019  . Benign prostatic hyperplasia 10/15/2019  . Blood glucose abnormal 10/15/2019  . Cervical radiculitis 10/15/2019  . Chronic obstructive lung disease ([place]) 10/15/2019  . Chronic pain 10/15/2019  . Cough 10/15/2019  . Hyperlipidemia 10/15/2019  . Irritant contact dermatitis due to plants, except food 10/15/2019  . 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 Hepatic steatosis 07/22/2017  . Pulmonary nodule 07/22/2017  . Abnormal chest x-ray 07/12/2017  . Former smoker 07/12/2017  . Shortness of breath 07/12/2017  . Neuropathic pain 10/20/2015  . 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 GERD (gastroesophageal reflux disease) 07/26/2011  . Encounter for screening colonoscopy 07/26/2011   Current Outpatient Medications on File Prior to Visit  Medication Sig Dispense Refill  . amoxicillin-clavulanate (AUGMENTIN) 875-125 MG per tablet Take 1 tablet by mouth 2 (two) times daily. For 10 days 20 tablet 0  . Blood Glucose Monitoring Suppl (ONE TOUCH ULTRA 2) w/Device KIT USE TO CHECK SUGAR LEVELS    . celecoxib (CELEBREX) 200 MG capsule Celebrex 200 mg capsule  take one capsule every day    . cetirizine (ZYRTEC) 10 MG tablet Take 10 mg by mouth daily.      . clobetasol (TEMOVATE) 0.05 % external solution clobetasol 0.05 % scalp solution    .  clotrimazole-betamethasone (LOTRISONE) cream Apply 1 application topically 2 (two) times daily.    [name] 18-103 MCG/ACT inhaler Inhale 2 puffs into the lungs every 6 (six) hours as needed. Cough, Shortness of breath    . CRESTOR 10 MG tablet Take 1 tablet by mouth at bedtime.    . cyclobenzaprine (FLEXERIL) 10 MG tablet Take 10 mg by mouth at bedtime as needed for muscle spasms.     . Dextromethorphan-Guaifenesin 60-1200 MG 12hr tablet Mucinex DM 60 mg-1,200 mg tablet,extended release 12 hr  Take 1 tablet twice a day by oral route as needed.    . doxazosin (CARDURA) 8 MG tablet Take 8 mg by mouth at bedtime.     Marland Kitchen doxycycline (VIBRAMYCIN) 100 MG capsule Take 1 capsule by mouth daily.    . DULoxetine (CYMBALTA) 60 MG capsule Take 60 mg by mouth at bedtime.     Marland Kitchen ezetimibe (ZETIA) 10 MG tablet Zetia 10 mg tablet  TAKE ONE TABLET BY MOUTH DAILY    . fenofibrate 160 MG tablet fenofibrate 160 mg tablet    . finasteride (PROSCAR) 5 MG tablet Take 5 mg by mouth daily.     . fluticasone (FLONASE) 50 MCG/ACT nasal spray Place 2 sprays into both nostrils daily.    Marland Kitchen gabapentin (NEURONTIN) 400 MG capsule gabapentin 400 mg capsule  TAKE ONE CAPSULE BY MOUTH EVERY 8 HOURS    . gemfibrozil (LOPID) 600 MG tablet gemfibrozil 600 mg tablet  Take 1 tablet twice a day by oral route for 90 days.    Marland Kitchen lidocaine (LIDODERM) 5 % Place  1 patch onto the skin every other day.    . loratadine (CLARITIN) 10 MG tablet loratadine 10 mg tablet  Take 1 tablet every day by oral route as needed.    . Naloxone HCl (EVZIO) 0.4 MG/0.4ML SOAJ Evzio 0.4 mg/0.4 mL injection,auto-injector  [USE ONE INJECTION AS NEEDED FOR OPIOID EMERGENCY.DOSE MAY BE REPEATED EVERY 2 - 3 MINS UNTIL EMERGENCY MEDICAL ASSISTANCE BECOMES AVAILAB    . NEXIUM 40 MG capsule Take 40 mg by mouth daily before breakfast.     . oxyCODONE-acetaminophen (PERCOCET) 7.5-325 MG per tablet Take 1 tablet by mouth every 4 (four) hours as needed. For pain     .  Pediatric Multivit-Minerals-C (GUMMI BEAR MULTIVITAMIN/MIN PO) Take 1 each by mouth daily.      . pravastatin (PRAVACHOL) 40 MG tablet pravastatin 40 mg tablet  TAKE ONE TABLET BY MOUTH DAILY     No current facility-administered medications on file prior to visit.   No Known Allergies  No results found for this or any previous visit (from the past 2160 hour(s)).  Objective: There were no vitals filed for this visit.  General: Patient is awake, alert, oriented x 3 and in no acute distress.  Dermatology: Skin is warm and dry bilateral with a now healed puncture wound at the plantar aspect of the right foot at the heel.  Vascular: Dorsalis Pedis pulse = 2/4 Bilateral,  Posterior Tibial pulse = 1/4 Bilateral,  Capillary Fill Time < 5 seconds  Neurologic: Gross sensation present via light touch.  Musculosketal: There is pain to palpation at the medial calcaneal tubercle greater than the lateral tubercle no pain to palpation to the plantar heel at previous puncture wound on right.  No results for input(s): GRAMSTAIN, LABORGA in the last 8760 hours.  Assessment and Plan:  Problem List Items Addressed This Visit    None    Visit Diagnoses    Puncture wound    -  Primary   f/u s/p in office exploration    Pain of right heel       Inflammatory pain of right heel       Relevant Medications   triamcinolone acetonide (KENALOG) 10 MG/ML injection 10 mg (Completed) (Start on 10/29/2019 12:15 PM)      -Examined patient and discussed the progression of the puncture wound that is now healed and likely residual inflammatory heel pain and treatment alternatives -After oral consent and aseptic prep, injected a mixture containing 1 ml of 2%  plain lidocaine, 1 ml 0.5% plain marcaine, 0.5 ml of kenalog 10 and 0.5 ml of dexamethasone phosphate into right heel medial approach at glabrous junction without complication. Post-injection care discussed with patient.  .-Dressings no longer needed since  previous puncture wound is healed -Advised patient to continue with cam boot for 1 more week then may slowly transition from Cam boot to tennis shoes with heel lifts -Patient to return to office if fails to continue to improve -Patient called office 4/8 and reports that his heel still hurts, we will proceed with CT of the heel to r/o any deeper soft tissue or osseous involvement, history of puncture wound.  Landis Martins, DPM

## 2019-11-13 ENCOUNTER — Telehealth: Payer: Self-pay | Admitting: Urology

## 2019-11-13 DIAGNOSIS — M86171 Other acute osteomyelitis, right ankle and foot: Secondary | ICD-10-CM

## 2019-11-13 NOTE — Telephone Encounter (Signed)
Please order CT R foot, heel pain with history of puncture wound stepped on nail with no improvement after conservative treatment Patient request CT to be done at [facility] in [facility] Thanks Dr. Cannon Kettle

## 2019-11-13 NOTE — Telephone Encounter (Signed)
Pt called and would like a CT done of his right heel at [facility] in [place]. Pt stats you informed him if he would like a CT done he just has to let us know and we will set it up. Please Advise.

## 2019-11-13 NOTE — Telephone Encounter (Signed)
Faxed orders to [facility] and sent to Dr. Leeanne Rio assistant for pre-cert.

## 2019-11-13 NOTE — Addendum Note (Signed)
Addended by: Harriett Sine D on: 11/13/2019 02:38 PM   Modules accepted: Orders

## 2019-11-20 NOTE — Telephone Encounter (Signed)
Pre-authorization has been approved for [employer] for Pt's Rt foot CT  Auth [id number]  Exp: 4/15-10/12 Medicare does not required authorization

## 2019-11-21 ENCOUNTER — Telehealth: Payer: Self-pay

## 2019-11-21 NOTE — Telephone Encounter (Signed)
CT appt was scheduled at [facility] radiology for Tues/20th at 3:30, pt to check in at 3:20, pt is to not eat or drink 2 hours prior to appt. Pt was informed

## 2019-12-01 ENCOUNTER — Telehealth: Payer: Self-pay

## 2019-12-01 NOTE — Telephone Encounter (Signed)
Can either of you call [facility] to have them fax the CT report and put it on my desk? Will discuss with patient once results are reviewed Thanks Dr. Chauncey Cruel

## 2019-12-01 NOTE — Telephone Encounter (Signed)
PT called requesting to know if his CT results are in. I check on pt's file and nothing is on his file

## 2019-12-02 NOTE — Telephone Encounter (Signed)
Pt was called to review CT results, and pt was told if problems are still occurring, he can contact the office to schedule up a F?U

## 2020-12-28 ENCOUNTER — Encounter: Payer: Self-pay | Admitting: Sports Medicine

## 2020-12-28 ENCOUNTER — Ambulatory Visit: Payer: Medicare HMO | Admitting: Sports Medicine

## 2020-12-28 ENCOUNTER — Other Ambulatory Visit: Payer: Self-pay | Admitting: Sports Medicine

## 2020-12-28 ENCOUNTER — Ambulatory Visit (INDEPENDENT_AMBULATORY_CARE_PROVIDER_SITE_OTHER): Payer: Medicare HMO

## 2020-12-28 ENCOUNTER — Other Ambulatory Visit: Payer: Self-pay

## 2020-12-28 DIAGNOSIS — G8929 Other chronic pain: Secondary | ICD-10-CM

## 2020-12-28 DIAGNOSIS — M79671 Pain in right foot: Secondary | ICD-10-CM

## 2020-12-28 DIAGNOSIS — M722 Plantar fascial fibromatosis: Secondary | ICD-10-CM | POA: Diagnosis not present

## 2020-12-28 DIAGNOSIS — T148XXA Other injury of unspecified body region, initial encounter: Secondary | ICD-10-CM | POA: Diagnosis not present

## 2020-12-28 NOTE — Progress Notes (Signed)
Subjective: Douglas Davidson is a 65 y.o. male patient seen in office for follow-up evaluation of right heel pain.  Patient reports that the pain is about the same even after last year in the right heel states that his heel still swells he has tried stretching icing and wearing his cam boot occasionally when changing up his style of tennis shoes but still has pain in the right heel.  Patient has a previous history last year of a puncture wound that has remained healed denies any significant redness warmth or drainage to the area.  No other pedal complaints noted.  Patient Active Problem List   Diagnosis Date Noted  . Acute sinusitis 10/15/2019  . Acute upper respiratory infection 10/15/2019  . Acute viral disease 10/15/2019  . Anxiety 10/15/2019  . Benign prostatic hyperplasia 10/15/2019  . Blood glucose abnormal 10/15/2019  . Cervical radiculitis 10/15/2019  . Chronic obstructive lung disease ([place]) 10/15/2019  . Chronic pain 10/15/2019  . Cough 10/15/2019  . Hyperlipidemia 10/15/2019  . Irritant contact dermatitis due to plants, except food 10/15/2019  . 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 Hepatic steatosis 07/22/2017  . Pulmonary nodule 07/22/2017  . Abnormal chest x-ray 07/12/2017  . Former smoker 07/12/2017  . Shortness of breath 07/12/2017  . Neuropathic pain 10/20/2015  . 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 GERD (gastroesophageal reflux disease) 07/26/2011  . Encounter for screening colonoscopy 07/26/2011   Current Outpatient Medications on File Prior to Visit  Medication Sig Dispense Refill  . amoxicillin-clavulanate (AUGMENTIN) 875-125 MG per tablet Take 1 tablet by mouth 2 (two) times daily. For 10 days 20 tablet 0  . celecoxib (CELEBREX) 200 MG capsule Celebrex 200 mg capsule  take one capsule every day    . cetirizine (ZYRTEC) 10 MG tablet Take 10 mg by mouth daily.      . clobetasol (TEMOVATE) 0.05 % external solution  clobetasol 0.05 % scalp solution    . clotrimazole-betamethasone (LOTRISONE) cream Apply 1 application topically 2 (two) times daily.    [name] 18-103 MCG/ACT inhaler Inhale 2 puffs into the lungs every 6 (six) hours as needed. Cough, Shortness of breath    . CRESTOR 10 MG tablet Take 1 tablet by mouth at bedtime.    . cyclobenzaprine (FLEXERIL) 10 MG tablet Take 10 mg by mouth at bedtime as needed for muscle spasms.     . Dextromethorphan-Guaifenesin 60-1200 MG 12hr tablet Mucinex DM 60 mg-1,200 mg tablet,extended release 12 hr  Take 1 tablet twice a day by oral route as needed.    . doxazosin (CARDURA) 8 MG tablet Take 8 mg by mouth at bedtime.     Marland Kitchen doxycycline (VIBRAMYCIN) 100 MG capsule Take 1 capsule by mouth daily.    . DULoxetine (CYMBALTA) 60 MG capsule Take 60 mg by mouth at bedtime.     Marland Kitchen ezetimibe (ZETIA) 10 MG tablet Zetia 10 mg tablet  TAKE ONE TABLET BY MOUTH DAILY    . fenofibrate 160 MG tablet fenofibrate 160 mg tablet    . finasteride (PROSCAR) 5 MG tablet Take 5 mg by mouth daily.     . fluticasone (FLONASE) 50 MCG/ACT nasal spray Place 2 sprays into both nostrils daily.    Marland Kitchen gabapentin (NEURONTIN) 400 MG capsule gabapentin 400 mg capsule  TAKE ONE CAPSULE BY MOUTH EVERY 8 HOURS    . gemfibrozil (LOPID) 600 MG tablet gemfibrozil 600 mg tablet  Take 1 tablet twice a day by  oral route for 90 days.    Marland Kitchen lidocaine (LIDODERM) 5 % Place 1 patch onto the skin every other day.    . loratadine (CLARITIN) 10 MG tablet loratadine 10 mg tablet  Take 1 tablet every day by oral route as needed.    . Naloxone HCl (EVZIO) 0.4 MG/0.4ML SOAJ Evzio 0.4 mg/0.4 mL injection,auto-injector  [USE ONE INJECTION AS NEEDED FOR OPIOID EMERGENCY.DOSE MAY BE REPEATED EVERY 2 - 3 MINS UNTIL EMERGENCY MEDICAL ASSISTANCE BECOMES AVAILAB    . NEXIUM 40 MG capsule Take 40 mg by mouth daily before breakfast.     . oxyCODONE-acetaminophen (PERCOCET) 7.5-325 MG per tablet Take 1 tablet by mouth every 4  (four) hours as needed. For pain     . Pediatric Multivit-Minerals-C (GUMMI BEAR MULTIVITAMIN/MIN PO) Take 1 each by mouth daily.      . pravastatin (PRAVACHOL) 40 MG tablet pravastatin 40 mg tablet  TAKE ONE TABLET BY MOUTH DAILY    . pregabalin (LYRICA) 150 MG capsule Take by mouth.     No current facility-administered medications on file prior to visit.   No Known Allergies  No results found for this or any previous visit (from the past 2160 hour(s)).  Objective: There were no vitals filed for this visit.  General: Patient is awake, alert, oriented x 3 and in no acute distress.  Dermatology: Skin is warm and dry bilateral.  No reoccurrence of puncture wound to plantar right heel.    Vascular: Dorsalis Pedis pulse = 2/4 Bilateral,  Posterior Tibial pulse = 1/4 Bilateral,  Capillary Fill Time < 5 seconds  Neurologic: Gross sensation present via light touch.  Musculosketal: There is pain to palpation at the medial calcaneal tubercle greater than the lateral tubercle no pain to palpation to the plantar heel at previous puncture wound on right.  There is pain in the medial arch along the plantar fascial course on the right likely could be related to a history of traumatic [place] fasciitis since previous puncture wound has healed versus chronic inflammation.  There is limited ankle joint range of motion on the right.   No results for input(s): GRAMSTAIN, LABORGA in the last 8760 hours.  Assessment and Plan:  Problem List Items Addressed This Visit   None   Visit Diagnoses    Chronic heel pain, right    -  Primary   Pain of right heel       Inflammatory pain of right heel       Puncture wound       History    Plantar fasciitis of right foot          -Examined patient  -Xrays reviewed revealing thickening of plantar fascial no obvious calcaneal lesions present to suggest any chronic infection of the heel -We will order a new CT scan for reevaluation to compare to last year's  imaging to see if there is any changes that suggest any worsening of symptoms or infection especially since patient had a previous puncture wound to this area -Dispensed plantar fascia brace to use as directed on right -Recommend continue with daily stretching and icing -Recommend continue with good supportive shoes daily for foot type -Return to office after CT scan or sooner if issues arise  Landis Martins, DPM

## 2020-12-30 ENCOUNTER — Telehealth: Payer: Medicare HMO

## 2020-12-30 NOTE — Telephone Encounter (Signed)
Contacted patient's insurance company to obtained authorization for CT of Rt Lower extremity w/o contrast.(73700) Authorization was  require [id number] Exp:12/30/20-06/28/21 [facility] and scheduled pt's appt for  For 01/05/21 checking in at 1 PM for a 1:15 appt.  Contacted pt and advised her of her appt date and time, pt was instructed of location address and phone number, pt stated understanding  Faxed order form to [facility]

## 2020-12-31 ENCOUNTER — Encounter: Payer: Self-pay | Admitting: Sports Medicine

## 2021-01-06 ENCOUNTER — Telehealth: Payer: Self-pay | Admitting: Sports Medicine

## 2021-01-06 NOTE — Telephone Encounter (Signed)
Pt had his CT scan yesterday and would like to know next steps. Please advise.

## 2021-01-14 DIAGNOSIS — N400 Enlarged prostate without lower urinary tract symptoms: Secondary | ICD-10-CM | POA: Insufficient documentation

## 2021-01-18 ENCOUNTER — Other Ambulatory Visit: Payer: Self-pay

## 2021-01-18 ENCOUNTER — Ambulatory Visit (INDEPENDENT_AMBULATORY_CARE_PROVIDER_SITE_OTHER): Payer: Medicare HMO | Admitting: Sports Medicine

## 2021-01-18 ENCOUNTER — Encounter: Payer: Self-pay | Admitting: Sports Medicine

## 2021-01-18 DIAGNOSIS — M722 Plantar fascial fibromatosis: Secondary | ICD-10-CM

## 2021-01-18 DIAGNOSIS — G8929 Other chronic pain: Secondary | ICD-10-CM

## 2021-01-18 DIAGNOSIS — M79671 Pain in right foot: Secondary | ICD-10-CM

## 2021-01-18 MED ORDER — TRIAMCINOLONE ACETONIDE 10 MG/ML IJ SUSP
10.0000 mg | Freq: Once | INTRAMUSCULAR | Status: AC
  Administered 2021-01-18: 10 mg

## 2021-01-18 NOTE — Progress Notes (Signed)
Subjective: Douglas Davidson is a 65 y.o. male returns to office for follow up evaluation of right heel pain. Reports that he had his CT and is here for results. States that he still has pain on the bottom of the heel pain is 6/10 but fascial brace helps some. Patient denies any other pedal complaints but has issue currently going on with his prostate.   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 For 10 days 20 tablet 0   celecoxib (CELEBREX) 200 MG capsule Celebrex 200 mg capsule  take one capsule every day     cetirizine (ZYRTEC) 10 MG tablet Take 10 mg by mouth daily.       clobetasol (TEMOVATE) 0.05 % external solution clobetasol 0.05 % scalp solution     clotrimazole-betamethasone (LOTRISONE) cream Apply 1 application topically 2 (two) times daily.     COMBIVENT 18-103 MCG/ACT inhaler Inhale 2 puffs into the lungs every 6 (six) hours as needed. Cough,  Shortness of breath     CRESTOR 10 MG tablet Take 1 tablet by mouth at bedtime.     cyclobenzaprine (FLEXERIL) 10 MG tablet Take 10 mg by mouth at bedtime as needed for muscle spasms.      Dextromethorphan-Guaifenesin 60-1200 MG 12hr tablet Mucinex DM 60 mg-1,200 mg tablet,extended release 12 hr  Take 1 tablet twice a day by oral route as needed.     doxazosin (CARDURA) 8 MG tablet Take 8 mg by mouth at bedtime.      doxycycline (VIBRAMYCIN) 100 MG capsule Take 1 capsule by mouth daily.     DULoxetine (CYMBALTA) 60 MG capsule Take 60 mg by mouth at bedtime.      ezetimibe (ZETIA) 10 MG tablet Zetia 10 mg tablet  TAKE ONE TABLET BY MOUTH DAILY     fenofibrate 160 MG tablet fenofibrate 160 mg tablet     finasteride (PROSCAR) 5 MG tablet Take 5 mg by mouth daily.      fluticasone (FLONASE) 50 MCG/ACT nasal spray Place 2 sprays into both nostrils daily.     gabapentin (NEURONTIN) 400 MG capsule gabapentin 400 mg capsule  TAKE ONE CAPSULE BY MOUTH EVERY 8 HOURS     gemfibrozil (LOPID) 600 MG tablet gemfibrozil 600 mg tablet  Take 1 tablet twice a day by oral route for 90 days.     lidocaine (LIDODERM) 5 % Place 1 patch onto the skin every other day.     loratadine (CLARITIN) 10 MG tablet loratadine 10 mg tablet  Take 1 tablet every day by oral route as needed.     Naloxone HCl (EVZIO) 0.4 MG/0.4ML SOAJ Evzio 0.4 mg/0.4 mL injection,auto-injector  [USE ONE INJECTION AS NEEDED FOR OPIOID EMERGENCY.DOSE MAY BE REPEATED EVERY 2 - 3 MINS UNTIL EMERGENCY MEDICAL ASSISTANCE BECOMES AVAILAB     NEXIUM 40 MG capsule Take 40 mg by mouth daily before breakfast.      oxyCODONE-acetaminophen (PERCOCET) 7.5-325 MG per tablet Take 1 tablet by mouth every 4 (four) hours as needed. For pain      Pediatric Multivit-Minerals-C (GUMMI BEAR MULTIVITAMIN/MIN PO) Take 1 each by mouth daily.       pravastatin (PRAVACHOL) 40 MG tablet pravastatin 40 mg tablet  TAKE ONE TABLET BY MOUTH DAILY     pregabalin (LYRICA) 150  MG capsule Take by mouth.     No current facility-administered medications on file prior to visit.    No Known Allergies  Objective:   General:  Alert and oriented x 3, in no acute distress  Dermatology: Skin is warm, dry, and supple bilateral. Nails are within normal limits. There is no lower extremity erythema, no eccymosis, no open lesions present bilateral. No puncture wound right.   Vascular: Dorsalis Pedis and Posterior Tibial pedal pulses are 1/4 bilateral. + hair growth noted bilateral. Capillary Fill Time is 3 seconds in all digits. No varicosities, No edema bilateral lower extremities.   Neurological: Sensation grossly intact to light touch on right heel.   Musculoskeletal: There is continued tenderness to palpation at the central>medial calcaneal tubercale and through the insertion of the plantar fascia on the right foot. No pain with compression to calcaneus or application of tuning fork. There is decreased Ankle joint range of motion bilateral. All other joints range of motion  within normal limits bilateral. Strength 5/5 bilateral.   Assessment and Plan: Problem List Items Addressed This Visit   None Visit Diagnoses     Pain of right heel    -  Primary   Inflammatory pain of right heel       Chronic heel pain, right       Relevant Medications   triamcinolone acetonide (KENALOG) 10 MG/ML injection 10 mg (Start on 01/18/2021  9:45 AM)   Plantar fasciitis of right foot       Relevant Medications   triamcinolone acetonide (KENALOG) 10 MG/ML injection 10 mg (Start on 01/18/2021  9:45 AM)       -Complete examination performed.  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 - Patient opted for injection today; After oral consent and aseptic prep, injected a mixture containing 1 ml of  1%plain lidocaine, 1 ml 0.5% plain marcaine, 0.5 ml of kenalog 10 and 0.5 ml of dexmethasone phosphate to right heel at area of most pain/trigger point injection. -Continue with plantar fascial brace on right -Continue with stretching, icing, good supportive shoes, inserts daily.  -Discussed long term care and reocurrence; will closely monitor; if fails to improve will consider other treatment modalities.  -Patient to return to office in 1 month for follow up or sooner if problems or questions arise.  Landis Martins, DPM

## 2021-02-18 ENCOUNTER — Other Ambulatory Visit: Payer: Self-pay

## 2021-02-18 ENCOUNTER — Encounter: Payer: Self-pay | Admitting: Sports Medicine

## 2021-02-18 ENCOUNTER — Ambulatory Visit: Payer: Medicare HMO | Admitting: Sports Medicine

## 2021-02-18 DIAGNOSIS — B351 Tinea unguium: Secondary | ICD-10-CM

## 2021-02-18 DIAGNOSIS — M722 Plantar fascial fibromatosis: Secondary | ICD-10-CM | POA: Diagnosis not present

## 2021-02-18 DIAGNOSIS — L603 Nail dystrophy: Secondary | ICD-10-CM

## 2021-02-18 DIAGNOSIS — G8929 Other chronic pain: Secondary | ICD-10-CM

## 2021-02-18 DIAGNOSIS — M79671 Pain in right foot: Secondary | ICD-10-CM | POA: Diagnosis not present

## 2021-02-18 NOTE — Progress Notes (Signed)
Subjective: Douglas Davidson is a 65 y.o. male returns to office for follow up evaluation of right heel pain. Reports that his heel feels really good currently has no pain but is concerned about his left great toenail it is really thickened and he wants to discuss possible removal.  Patient denies any other pedal complaints at this time.  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 For 10 days 20 tablet 0   celecoxib (CELEBREX) 200 MG capsule Celebrex 200 mg capsule  take one capsule every day     cetirizine (ZYRTEC) 10 MG tablet Take 10 mg by mouth daily.       clobetasol (TEMOVATE) 0.05 % external solution clobetasol 0.05 % scalp solution     clotrimazole-betamethasone (LOTRISONE) cream Apply 1 application topically 2 (two) times daily.     COMBIVENT 18-103 MCG/ACT inhaler Inhale 2 puffs into the lungs every 6 (six) hours as needed. Cough, Shortness of  breath     CRESTOR 10 MG tablet Take 1 tablet by mouth at bedtime.     cyclobenzaprine (FLEXERIL) 10 MG tablet Take 10 mg by mouth at bedtime as needed for muscle spasms.      Dextromethorphan-Guaifenesin 60-1200 MG 12hr tablet Mucinex DM 60 mg-1,200 mg tablet,extended release 12 hr  Take 1 tablet twice a day by oral route as needed.     doxazosin (CARDURA) 8 MG tablet Take 8 mg by mouth at bedtime.      doxycycline (VIBRAMYCIN) 100 MG capsule Take 1 capsule by mouth daily.     DULoxetine (CYMBALTA) 60 MG capsule Take 60 mg by mouth at bedtime.      ezetimibe (ZETIA) 10 MG tablet Zetia 10 mg tablet  TAKE ONE TABLET BY MOUTH DAILY     fenofibrate 160 MG tablet fenofibrate 160 mg tablet     finasteride (PROSCAR) 5 MG tablet Take 5 mg by mouth daily.      fluticasone (FLONASE) 50 MCG/ACT nasal spray Place 2 sprays into both nostrils daily.     gabapentin (NEURONTIN) 400 MG capsule gabapentin 400 mg capsule  TAKE ONE CAPSULE BY MOUTH EVERY 8 HOURS     gemfibrozil (LOPID) 600 MG tablet gemfibrozil 600 mg tablet  Take 1 tablet twice a day by oral route for 90 days.     lidocaine (LIDODERM) 5 % Place 1 patch onto the skin every other day.     loratadine (CLARITIN) 10 MG tablet loratadine 10 mg tablet  Take 1 tablet every day by  oral route as needed.     Naloxone HCl (EVZIO) 0.4 MG/0.4ML SOAJ Evzio 0.4 mg/0.4 mL injection,auto-injector  [USE ONE INJECTION AS NEEDED FOR OPIOID EMERGENCY.DOSE MAY BE REPEATED EVERY 2 - 3 MINS UNTIL EMERGENCY MEDICAL ASSISTANCE BECOMES AVAILAB     NEXIUM 40 MG capsule Take 40 mg by mouth daily before breakfast.      oxyCODONE-acetaminophen (PERCOCET) 7.5-325 MG per tablet Take 1 tablet by mouth every 4 (four) hours as needed. For pain      Pediatric Multivit-Minerals-C (GUMMI BEAR MULTIVITAMIN/MIN PO) Take 1 each by mouth daily.       pravastatin (PRAVACHOL) 40 MG tablet pravastatin 40 mg tablet  TAKE ONE TABLET BY MOUTH DAILY     pregabalin (LYRICA) 150 MG capsule  Take by mouth.     No current facility-administered medications on file prior to visit.    No Known Allergies  Objective:   General:  Alert and oriented x 3, in no acute distress  Dermatology: Skin is warm, dry, and supple bilateral. Nails are within normal limits except that left great toenail where there is significant thickening and discoloration and subungual debris to the left hallux nail medial two thirds of the nail most involved with patient history of previous nail trauma. There is no lower extremity erythema, no eccymosis, no open lesions present bilateral.  Vascular: Dorsalis Pedis and Posterior Tibial pedal pulses are 1/4 bilateral. + hair growth noted bilateral. Capillary Fill Time is 3 seconds in all digits. No varicosities, No edema bilateral lower extremities.   Neurological: Sensation grossly intact to light touch on right heel.   Musculoskeletal: No pain to palpation to the insertion of the plantar fascia on the right foot.  No pain to palpation to left hallux.. Strength 5/5 bilateral.   Assessment and Plan: Problem List Items Addressed This Visit   None Visit Diagnoses     Pain of right heel    -  Primary   Plantar fasciitis of right foot       Inflammatory pain of right heel       Chronic heel pain, right       Nail dystrophy       Onychomycosis of left great toe           -Complete examination performed.  -Discussed continued care for resolved right heel pain -Continue with plantar fascial brace on right for 1 more month -Continue with stretching, icing, good supportive shoes, inserts daily.  -Discussed long term care and reocurrence; will closely monitor; if fails to improve will consider other treatment modalities.  -Discussed with patient likely onychomycosis of left hallux nail with nail deformity -Patient to return to office when he is ready for a total temporary nail avulsion procedure on the left great toe -Patient to return to office as scheduled  or sooner if problems or questions arise.  Landis Martins, DPM

## 2021-03-09 ENCOUNTER — Ambulatory Visit: Payer: Medicare HMO | Admitting: Sports Medicine

## 2021-04-26 DIAGNOSIS — M961 Postlaminectomy syndrome, not elsewhere classified: Secondary | ICD-10-CM | POA: Insufficient documentation

## 2021-05-25 DIAGNOSIS — E119 Type 2 diabetes mellitus without complications: Secondary | ICD-10-CM | POA: Insufficient documentation

## 2023-07-03 DIAGNOSIS — L219 Seborrheic dermatitis, unspecified: Secondary | ICD-10-CM | POA: Insufficient documentation

## 2023-07-03 DIAGNOSIS — M545 Low back pain, unspecified: Secondary | ICD-10-CM | POA: Insufficient documentation

## 2024-01-15 DIAGNOSIS — J302 Other seasonal allergic rhinitis: Secondary | ICD-10-CM | POA: Insufficient documentation

## 2024-03-06 ENCOUNTER — Ambulatory Visit (INDEPENDENT_AMBULATORY_CARE_PROVIDER_SITE_OTHER): Admitting: Podiatry

## 2024-03-06 DIAGNOSIS — B351 Tinea unguium: Secondary | ICD-10-CM | POA: Diagnosis not present

## 2024-03-06 NOTE — Progress Notes (Signed)
    Subjective:  Patient ID: Douglas Davidson, male    DOB: [birth year],  MRN: [id number]  ANDRELL BERGESON presents to clinic today for concern of fungal nails to the bilateral hallux toenails.  States has been going on for the past few years and he thinks it began to start on the right.  It began on the left and it is very brittle and catches on his socks.  It is difficult for him to trim.  He would like to start treatment.  PCP is Tessie Hoover, FNP.  Past Medical History:  Diagnosis Date   Anxiety    Arthritis    BPH (benign prostatic hypertrophy)    Chronic back pain    & neck   COPD (chronic obstructive pulmonary disease) (HCC)    GERD (gastroesophageal reflux disease) 2003   Hypertension    Spinal cord stimulator status    Past Surgical History:  Procedure Laterality Date   ABDOMINAL SURGERY  1992   adhesions/?colon   APPENDECTOMY     POLYPECTOMY  08/24/2011   Procedure: POLYPECTOMY;  Surgeon: Lamar CHRISTELLA Hollingshead, MD;  Location: [facility];  Service: Endoscopy;  Laterality: N/A;  colon polyps   SHOULDER SURGERY  right shoulder   rotator cuff   SPINAL CORD STIMULATOR INSERTION     TENDON REPAIR  right wrist   No Known Allergies  Review of Systems: Negative except as noted in the HPI.  Objective:  Vascular Examination: Capillary refill time is 3-5 seconds to toes bilateral. Palpable pedal pulses b/l LE. Digital hair present b/l.    Dermatological Examination: Pedal skin with normal turgor, texture and tone b/l. No open wounds. No interdigital macerations b/l.  The bilateral hallux toenails are 3mm thick, discolored, dystrophic with subungual debris. There is pain with compression of the nail plates.  The left hallux is more involved than the right  Assessment/Plan: 1. Dermatophytosis of nail     The mycotic toenails on bilateral hallux were sharply debrided  with sterile nail nippers and a power debriding burr to decrease bulk/thickness and length.    Clippings of the  affected toenails were obtained and sent to [facility] laboratory for fungal nail culture.  Informed patient it may take up to 3 weeks to receive the final report.  Will contact the patient to review the results.  We will discuss treatment plan at that time and follow-up.  Will most likely begin topical therapy based on the fungal nail culture results.  Discussed topical, oral, and laser nail treatment options with the patient today.  Discussed risks involved as well.  Follow-up 4 months   Reilynn Lauro DSABRA [place], DPM, FACFAS [facility]     [address] [place], [place] [address]                Office [phone]  Fax [phone]

## 2024-03-13 ENCOUNTER — Ambulatory Visit: Payer: Self-pay | Admitting: Podiatry

## 2024-07-09 ENCOUNTER — Ambulatory Visit: Admitting: Podiatry

## 2024-07-09 DIAGNOSIS — M79675 Pain in left toe(s): Secondary | ICD-10-CM | POA: Diagnosis not present

## 2024-07-09 DIAGNOSIS — M79674 Pain in right toe(s): Secondary | ICD-10-CM | POA: Diagnosis not present

## 2024-07-09 DIAGNOSIS — B351 Tinea unguium: Secondary | ICD-10-CM

## 2024-07-09 NOTE — Progress Notes (Signed)
      Subjective:  Patient ID: Douglas Davidson, male    DOB: Dec 26, 1955,  MRN: [id number]  Douglas Davidson presents to clinic today for fungal nail check.  Since last seen, he has not been treating with anything.  He was informed that his fungal nail culture came back positive for saprophytic molds.  Topical medications typically do not work well on these organisms.  PCP is Tessie Hoover, FNP.  Past Medical History:  Diagnosis Date   Anxiety    Arthritis    BPH (benign prostatic hypertrophy)    Chronic back pain    & neck   COPD (chronic obstructive pulmonary disease) (HCC)    GERD (gastroesophageal reflux disease) 2003   Hypertension    Spinal cord stimulator status    Past Surgical History:  Procedure Laterality Date   ABDOMINAL SURGERY  1992   adhesions/?colon   APPENDECTOMY     POLYPECTOMY  08/24/2011   Procedure: POLYPECTOMY;  Surgeon: Lamar CHRISTELLA Hollingshead, MD;  Location: [facility];  Service: Endoscopy;  Laterality: N/A;  colon polyps   SHOULDER SURGERY  right shoulder   rotator cuff   SPINAL CORD STIMULATOR INSERTION     TENDON REPAIR  right wrist   No Known Allergies  Review of Systems: Negative except as noted in the HPI.  Objective:  Vascular Examination: Capillary refill time is 3-5 seconds to toes bilateral. Palpable pedal pulses b/l LE. Digital hair present b/l.    Dermatological Examination: Pedal skin with normal turgor, texture and tone b/l. No open wounds. No interdigital macerations b/l.  The 10 toenails are 3mm thick, discolored, dystrophic with subungual debris. There is pain with compression of the nail plates.    Assessment/Plan: 1. Pain due to onychomycosis of toenails of both feet   2. Dermatophytosis of nail    The mycotic toenails were sharply debrided x 10 with sterile nail nippers and a power debriding burr to decrease bulk/thickness and length.    Discussed topical, oral, and laser nail treatment options with the patient today.  Discussed  risks involved as well.  Will get him set up for hepatic function panel.  Since he does take a statin medication, we will repeat the hepatic function panel 6 weeks into therapy.  Will wait and see if the liver enzymes are normal before prescribing the oral terbinafine.  Follow-up 3 months  Faithanne Verret DSABRA [place], DPM, FACFAS [facility]     [address] [place], [place] [address]                Office [phone]  Fax [phone]

## 2024-07-10 ENCOUNTER — Ambulatory Visit: Payer: Self-pay | Admitting: Podiatry

## 2024-07-10 LAB — HEPATIC FUNCTION PANEL
ALT: 31 IU/L (ref 0–44)
AST: 26 IU/L (ref 0–40)
Albumin: 4.5 g/dL (ref 3.9–4.9)
Alkaline Phosphatase: 75 IU/L (ref 47–123)
Bilirubin Total: 0.3 mg/dL (ref 0.0–1.2)
Bilirubin, Direct: 0.13 mg/dL (ref 0.00–0.40)
Total Protein: 6.9 g/dL (ref 6.0–8.5)

## 2024-07-10 MED ORDER — TERBINAFINE HCL 250 MG PO TABS: 250.0000 mg | ORAL_TABLET | Freq: Every day | ORAL | 0 refills | Status: AC

## 2024-10-08 ENCOUNTER — Ambulatory Visit: Admitting: Podiatry
